# Patient Record
Sex: Female | Born: 1985 | Race: White | Hispanic: No | Marital: Single | Smoking: Never smoker
Health system: Southern US, Community
[De-identification: ages and names within clinical notes are randomized; demographics above are authoritative.]

## PROBLEM LIST (undated history)

## (undated) DIAGNOSIS — M549 Dorsalgia, unspecified: Secondary | ICD-10-CM

## (undated) DIAGNOSIS — G8929 Other chronic pain: Secondary | ICD-10-CM

## (undated) DIAGNOSIS — R519 Headache, unspecified: Secondary | ICD-10-CM

## (undated) DIAGNOSIS — E039 Hypothyroidism, unspecified: Secondary | ICD-10-CM

---
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
## 2007-11-26 ENCOUNTER — Emergency Department (HOSPITAL_COMMUNITY): Admission: EM | Admit: 2007-11-26 | Discharge: 2007-11-26 | Payer: Self-pay | Admitting: Emergency Medicine

## 2010-11-24 LAB — URINALYSIS, ROUTINE W REFLEX MICROSCOPIC
Bilirubin Urine: NEGATIVE
Nitrite: NEGATIVE
Protein, ur: NEGATIVE
Specific Gravity, Urine: 1.02
Urobilinogen, UA: 0.2

## 2010-11-24 LAB — PREGNANCY, URINE: Preg Test, Ur: NEGATIVE

## 2010-11-24 LAB — URINE MICROSCOPIC-ADD ON

## 2011-05-19 ENCOUNTER — Emergency Department (HOSPITAL_COMMUNITY): Payer: No Typology Code available for payment source

## 2011-05-19 ENCOUNTER — Encounter (HOSPITAL_COMMUNITY): Payer: Self-pay | Admitting: *Deleted

## 2011-05-19 ENCOUNTER — Emergency Department (HOSPITAL_COMMUNITY)
Admission: EM | Admit: 2011-05-19 | Discharge: 2011-05-19 | Disposition: A | Discharge status: Home / Self Care | Payer: No Typology Code available for payment source | Attending: Emergency Medicine | Admitting: Emergency Medicine

## 2011-05-19 DIAGNOSIS — S139XXA Sprain of joints and ligaments of unspecified parts of neck, initial encounter: Secondary | ICD-10-CM | POA: Insufficient documentation

## 2011-05-19 DIAGNOSIS — S39012A Strain of muscle, fascia and tendon of lower back, initial encounter: Secondary | ICD-10-CM

## 2011-05-19 DIAGNOSIS — M545 Low back pain, unspecified: Secondary | ICD-10-CM | POA: Insufficient documentation

## 2011-05-19 DIAGNOSIS — Y92410 Unspecified street and highway as the place of occurrence of the external cause: Secondary | ICD-10-CM | POA: Insufficient documentation

## 2011-05-19 DIAGNOSIS — G8929 Other chronic pain: Secondary | ICD-10-CM | POA: Insufficient documentation

## 2011-05-19 DIAGNOSIS — S335XXA Sprain of ligaments of lumbar spine, initial encounter: Secondary | ICD-10-CM | POA: Insufficient documentation

## 2011-05-19 DIAGNOSIS — S161XXA Strain of muscle, fascia and tendon at neck level, initial encounter: Secondary | ICD-10-CM

## 2011-05-19 MED ORDER — CYCLOBENZAPRINE HCL 10 MG PO TABS
10.0000 mg | ORAL_TABLET | Freq: Once | ORAL | Status: AC
  Administered 2011-05-19: 10 mg via ORAL
  Filled 2011-05-19: qty 1

## 2011-05-19 MED ORDER — HYDROCODONE-ACETAMINOPHEN 5-325 MG PO TABS: 1.0000 | ORAL_TABLET | Freq: Four times a day (QID) | ORAL | Status: AC | PRN

## 2011-05-19 MED ORDER — HYDROCODONE-ACETAMINOPHEN 5-325 MG PO TABS
1.0000 | ORAL_TABLET | Freq: Once | ORAL | Status: AC
  Administered 2011-05-19: 1 via ORAL
  Filled 2011-05-19: qty 1

## 2011-05-19 MED ORDER — IBUPROFEN 800 MG PO TABS
800.0000 mg | ORAL_TABLET | Freq: Once | ORAL | Status: AC
  Administered 2011-05-19: 800 mg via ORAL
  Filled 2011-05-19: qty 1

## 2011-05-19 MED ORDER — CYCLOBENZAPRINE HCL 10 MG PO TABS: ORAL_TABLET | ORAL | Status: DC

## 2011-05-19 NOTE — ED Notes (Signed)
Pt DC to home with steady gait 
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
## 2011-05-19 NOTE — Discharge Instructions (Signed)
Cryotherapy Cryotherapy means treatment with cold. Ice or gel packs can be used to reduce both pain and swelling. Ice is the most helpful within the first 24 to 48 hours after an injury or flareup from overusing a muscle or joint. Sprains, strains, spasms, burning pain, shooting pain, and aches can all be eased with ice. Ice can also be used when recovering from surgery. Ice is effective, has very few side effects, and is safe for most people to use. PRECAUTIONS  Ice is not a safe treatment option for people with:  Raynaud's phenomenon. This is a condition affecting small blood vessels in the extremities. Exposure to cold may cause your problems to return.   Cold hypersensitivity. There are many forms of cold hypersensitivity, including:   Cold urticaria. Red, itchy hives appear on the skin when the tissues begin to warm after being iced.   Cold erythema. This is a red, itchy rash caused by exposure to cold.   Cold hemoglobinuria. Red blood cells break down when the tissues begin to warm after being iced. The hemoglobin that carry oxygen are passed into the urine because they cannot combine with blood proteins fast enough.   Numbness or altered sensitivity in the area being iced.  If you have any of the following conditions, do not use ice until you have discussed cryotherapy with your caregiver:  Heart conditions, such as arrhythmia, angina, or chronic heart disease.   High blood pressure.   Healing wounds or open skin in the area being iced.   Current infections.   Rheumatoid arthritis.   Poor circulation.   Diabetes.  Ice slows the blood flow in the region it is applied. This is beneficial when trying to stop inflamed tissues from spreading irritating chemicals to surrounding tissues. However, if you expose your skin to cold temperatures for too long or without the proper protection, you can damage your skin or nerves. Watch for signs of skin damage due to cold. HOME CARE  INSTRUCTIONS Follow these tips to use ice and cold packs safely.  Place a dry or damp towel between the ice and skin. A damp towel will cool the skin more quickly, so you may need to shorten the time that the ice is used.   For a more rapid response, add gentle compression to the ice.   Ice for no more than 10 to 20 minutes at a time. The bonier the area you are icing, the less time it will take to get the benefits of ice.   Check your skin after 5 minutes to make sure there are no signs of a poor response to cold or skin damage.   Rest 20 minutes or more in between uses.   Once your skin is numb, you can end your treatment. You can test numbness by very lightly touching your skin. The touch should be so light that you do not see the skin dimple from the pressure of your fingertip. When using ice, most people will feel these normal sensations in this order: cold, burning, aching, and numbness.   Do not use ice on someone who cannot communicate their responses to pain, such as small children or people with dementia.  HOW TO MAKE AN ICE PACK Ice packs are the most common way to use ice therapy. Other methods include ice massage, ice baths, and cryo-sprays. Muscle creams that cause a cold, tingly feeling do not offer the same benefits that ice offers and should not be used as a substitute  unless recommended by your caregiver. To make an ice pack, do one of the following:  Place crushed ice or a bag of frozen vegetables in a sealable plastic bag. Squeeze out the excess air. Place this bag inside another plastic bag. Slide the bag into a pillowcase or place a damp towel between your skin and the bag.   Mix 3 parts water with 1 part rubbing alcohol. Freeze the mixture in a sealable plastic bag. When you remove the mixture from the freezer, it will be slushy. Squeeze out the excess air. Place this bag inside another plastic bag. Slide the bag into a pillowcase or place a damp towel between your skin  and the bag.  SEEK MEDICAL CARE IF:  You develop white spots on your skin. This may give the skin a blotchy (mottled) appearance.   Your skin turns blue or pale.   Your skin becomes waxy or hard.   Your swelling gets worse.  MAKE SURE YOU:   Understand these instructions.   Will watch your condition.   Will get help right away if you are not doing well or get worse.  Document Released: 10/06/2010 Document Revised: 01/29/2011 Document Reviewed: 10/06/2010 [facility] Patient Information 2012 [place], [place].Muscle Stra   A muscle strain, or pulled muscle, occurs when a muscle is over-stretched. A small number of muscle fibers may also be torn. This is especially common in athletes. This happens when a sudden violent force placed on a muscle pushes it past its capacity. Usually, recovery from a pulled muscle takes 1 to 2 weeks. But complete healing will take 5 to 6 weeks. There are millions of muscle fibers. Following injury, your body will usually return to normal quickly. HOME CARE INSTRUCTIONS   While awake, apply ice to the sore muscle for 15 to 20 minutes each hour for the first 2 days. Put ice in a plastic bag and place a towel between the bag of ice and your skin.   Do not use the pulled muscle for several days. Do not use the muscle if you have pain.   You may wrap the injured area with an elastic bandage for comfort. Be careful not to bind it too tightly. This may interfere with blood circulation.   Only take over-the-counter or prescription medicines for pain, discomfort, or fever as directed by your caregiver. Do not use aspirin as this will increase bleeding (bruising) at injury site.   Warming up before exercise helps prevent muscle strains.  SEEK MEDICAL CARE IF:  There is increased pain or swelling in the affected area. MAKE SURE YOU:   Understand these instructions.   Will watch your condition.   Will get help right away if you are not doing well or get worse.    Document Released: 02/09/2005 Document Revised: 01/29/2011 Document Reviewed: 09/08/2006 [facility] Patient Information 2012 [place], [place].      Take the meds as directed.  Take ibuprofen 800 mg every 8 hrs with food.  Apply ice several times daily to areas of soreness.  Follow up with dr. Hilda Lias as needed.  You might consider getting re-established with a pain management clinic.

## 2011-05-19 NOTE — ED Notes (Signed)
C-collar applied secondary to neck pain.

## 2011-05-23 NOTE — ED Provider Notes (Signed)
Medical screening examination/treatment/procedure(s) were performed by non-physician practitioner and as supervising physician I was immediately available for consultation/collaboration.  Flint Melter, MD 05/23/11 [phone]

## 2011-06-20 ENCOUNTER — Emergency Department (HOSPITAL_COMMUNITY)
Admission: EM | Admit: 2011-06-20 | Discharge: 2011-06-21 | Disposition: A | Discharge status: Home / Self Care | Payer: Self-pay | Attending: Emergency Medicine | Admitting: Emergency Medicine

## 2011-06-20 ENCOUNTER — Encounter (HOSPITAL_COMMUNITY): Payer: Self-pay | Admitting: *Deleted

## 2011-06-20 DIAGNOSIS — R112 Nausea with vomiting, unspecified: Secondary | ICD-10-CM | POA: Insufficient documentation

## 2011-06-20 DIAGNOSIS — M545 Low back pain, unspecified: Secondary | ICD-10-CM | POA: Insufficient documentation

## 2011-06-20 DIAGNOSIS — K5900 Constipation, unspecified: Secondary | ICD-10-CM | POA: Insufficient documentation

## 2011-06-20 DIAGNOSIS — R10819 Abdominal tenderness, unspecified site: Secondary | ICD-10-CM | POA: Insufficient documentation

## 2011-06-20 DIAGNOSIS — R350 Frequency of micturition: Secondary | ICD-10-CM | POA: Insufficient documentation

## 2011-06-20 DIAGNOSIS — R109 Unspecified abdominal pain: Secondary | ICD-10-CM | POA: Insufficient documentation

## 2011-06-20 DIAGNOSIS — N12 Tubulo-interstitial nephritis, not specified as acute or chronic: Secondary | ICD-10-CM | POA: Insufficient documentation

## 2011-06-20 DIAGNOSIS — R51 Headache: Secondary | ICD-10-CM | POA: Insufficient documentation

## 2011-06-20 DIAGNOSIS — Z79899 Other long term (current) drug therapy: Secondary | ICD-10-CM | POA: Insufficient documentation

## 2011-06-20 LAB — URINE MICROSCOPIC-ADD ON

## 2011-06-20 LAB — URINALYSIS, ROUTINE W REFLEX MICROSCOPIC
Glucose, UA: NEGATIVE mg/dL
Protein, ur: NEGATIVE mg/dL
pH: 6 (ref 5.0–8.0)

## 2011-06-20 LAB — PREGNANCY, URINE: Preg Test, Ur: NEGATIVE

## 2011-06-20 MED ORDER — ONDANSETRON 4 MG PO TBDP
8.0000 mg | ORAL_TABLET | Freq: Once | ORAL | Status: AC
  Administered 2011-06-20: 8 mg via ORAL
  Filled 2011-06-20: qty 2

## 2011-06-20 MED ORDER — KETOROLAC TROMETHAMINE 60 MG/2ML IM SOLN
60.0000 mg | Freq: Once | INTRAMUSCULAR | Status: AC
  Administered 2011-06-20: 60 mg via INTRAMUSCULAR
  Filled 2011-06-20: qty 2

## 2011-06-20 NOTE — ED Provider Notes (Signed)
History     CSN: [id number]  Arrival date & time 06/20/11  2209   First MD Initiated Contact with Patient 06/20/11 2246      Chief Complaint  Patient presents with  . Back Pain    (Consider location/radiation/quality/duration/timing/severity/associated sxs/prior treatment) Patient is a 26 y.o. female presenting with back pain. The history is provided by the patient.  Back Pain  This is a new problem. The current episode started more than 2 days ago. The problem occurs constantly. The problem has been gradually worsening. The pain is associated with no known injury. The pain is present in the lumbar spine. The pain does not radiate. The pain is at a severity of 6/10. The pain is moderate. The symptoms are aggravated by bending and twisting. Associated symptoms include headaches. Pertinent negatives include no fever, no numbness, no abdominal pain, no abdominal swelling, no bowel incontinence, no perianal numbness, no bladder incontinence, no dysuria, no pelvic pain, no leg pain, no paresthesias and no weakness.  Pt stats pain is across her back. Hx of back problems, but states this does not feel the same. States "I feel like it is my kidneys." Pt reports hx of pyelonephritis, however, states this does not feel like that, but also does not feel like any pain she has had before. States began having nausea and vomiting for the last two days. States cant keep anything down. Also headaches. Denies fever, chills, neck pain or stiffness, chest pain, abdominal pain, diarrhea.   Past Medical History  Diagnosis Date  . Back pain     History reviewed. No pertinent past surgical history.  No family history on file.  History  Substance Use Topics  . Smoking status: Never Smoker   . Smokeless tobacco: Not on file  . Alcohol Use: No    OB History    Grav Para Term Preterm Abortions TAB SAB Ect Mult Living                  Review of Systems  Constitutional: Negative for fever and chills.    Respiratory: Negative.   Cardiovascular: Negative.   Gastrointestinal: Positive for nausea, vomiting and constipation. Negative for abdominal pain, diarrhea and bowel incontinence.  Genitourinary: Positive for frequency and flank pain. Negative for bladder incontinence, dysuria, urgency, vaginal bleeding, vaginal discharge and pelvic pain.  Musculoskeletal: Positive for back pain.  Skin: Negative.   Neurological: Positive for headaches. Negative for weakness, numbness and paresthesias.    Allergies  Penicillins  Home Medications   Current Outpatient Rx  Name Route Sig Dispense Refill  . ACETAMINOPHEN 500 MG PO TABS Oral Take 1,000-1,500 mg by mouth every 6 (six) hours as needed. For headache    . CYCLOBENZAPRINE HCL 10 MG PO TABS  1/2 to one tab po TID 20 tablet 0  . LEVONORGESTREL 20 MCG/24HR IU IUD Intrauterine 1 each by Intrauterine route once.      BP 125/75  Pulse 84  Temp(Src) 98.2 F (36.8 C) (Oral)  Resp 18  SpO2 100%  Physical Exam  Nursing note and vitals reviewed. Constitutional: She is oriented to person, place, and time. She appears well-developed and well-nourished. No distress.  HENT:  Head: Normocephalic and atraumatic.  Eyes: Conjunctivae are normal.  Neck: Neck supple.  Cardiovascular: Normal rate and regular rhythm.   Pulmonary/Chest: Effort normal and breath sounds normal. No respiratory distress. She has no wheezes.  Abdominal: Soft. Bowel sounds are normal.       Suprapubic tenderness, no  guarding, no rebound tenderness. bilateral CVA tenderness  Neurological: She is alert and oriented to person, place, and time.  Skin: Skin is warm and dry.  Psychiatric: She has a normal mood and affect.    ED Course  Procedures (including critical care time)  Pt with lower back pain, hx of pyelonephritis. No hx of kidney stones. Will get UA. VS normal. She does not appear toxic  Results for orders placed during the hospital encounter of 06/20/11  URINALYSIS,  ROUTINE W REFLEX MICROSCOPIC      Component Value Range   Color, Urine YELLOW  YELLOW    APPearance CLOUDY (*) CLEAR    Specific Gravity, Urine 1.023  1.005 - 1.030    pH 6.0  5.0 - 8.0    Glucose, UA NEGATIVE  NEGATIVE (mg/dL)   Hgb urine dipstick TRACE (*) NEGATIVE    Bilirubin Urine NEGATIVE  NEGATIVE    Ketones, ur NEGATIVE  NEGATIVE (mg/dL)   Protein, ur NEGATIVE  NEGATIVE (mg/dL)   Urobilinogen, UA 0.2  0.0 - 1.0 (mg/dL)   Nitrite POSITIVE (*) NEGATIVE    Leukocytes, UA SMALL (*) NEGATIVE   PREGNANCY, URINE      Component Value Range   Preg Test, Ur NEGATIVE  NEGATIVE   URINE MICROSCOPIC-ADD ON      Component Value Range   Squamous Epithelial / LPF FEW (*) RARE    WBC, UA 7-10  <3 (WBC/hpf)   RBC / HPF 3-6  <3 (RBC/hpf)   Bacteria, UA MANY (*) RARE    No results found.  UA infected. Pt denies vaginal discharge, denies vaginal bleeding. Will treat UTI. Pt is tolerating PO fluids in ED. Continues to be in no distress. She has no other medical problems. Will follow up outpatient.   1. Pyelonephritis       MDM         Lottie Mussel, PA 06/21/11 0024  Lottie Mussel, PA 06/21/11 0025

## 2011-06-20 NOTE — ED Notes (Signed)
The pt has had flank pain for one week with nausea and she has had headaches.  lmp she has an iud

## 2011-06-21 MED ORDER — CIPROFLOXACIN HCL 500 MG PO TABS: 500.0000 mg | ORAL_TABLET | Freq: Two times a day (BID) | ORAL | Status: AC

## 2011-06-21 MED ORDER — PROMETHAZINE HCL 12.5 MG PO TABS: 12.5000 mg | ORAL_TABLET | Freq: Four times a day (QID) | ORAL | Status: DC | PRN

## 2011-06-21 MED ORDER — HYDROCODONE-ACETAMINOPHEN 5-500 MG PO TABS: 1.0000 | ORAL_TABLET | Freq: Four times a day (QID) | ORAL | Status: AC | PRN

## 2011-06-21 NOTE — Discharge Instructions (Signed)
Your urine is infected. Make sure to continue to drink plenty of fluids. Take phenergan for nausea as prescribed. Take vicodin as prescribed as needed for severe pain. Cipro as prescribed until all gone for the infection. Follow up or have yourself rechecked in 3-5 days. Return if worsening or unable to keep down medications   Pyelonephritis, Adult Pyelonephritis is a kidney infection. In general, there are 2 main types of pyelonephritis:  Infections that come on quickly without any warning (acute pyelonephritis).   Infections that persist for a long period of time (chronic pyelonephritis).  CAUSES  Two main causes of pyelonephritis are:  Bacteria traveling from the bladder to the kidney. This is a problem especially in pregnant women. The urine in the bladder can become filled with bacteria from multiple causes, including:   Inflammation of the prostate gland (prostatitis).   Sexual intercourse in females.   Bladder infection (cystitis).   Bacteria traveling from the bloodstream to the tissue part of the kidney.  Problems that may increase your risk of getting a kidney infection include:  Diabetes.   Kidney stones or bladder stones.   Cancer.   Catheters placed in the bladder.   Other abnormalities of the kidney or ureter.  SYMPTOMS   Abdominal pain.   Pain in the side or flank area.   Fever.   Chills.   Upset stomach.   Blood in the urine (dark urine).   Frequent urination.   Strong or persistent urge to urinate.   Burning or stinging when urinating.  DIAGNOSIS  Your caregiver may diagnose your kidney infection based on your symptoms. A urine sample may also be taken. TREATMENT  In general, treatment depends on how severe the infection is.   If the infection is mild and caught early, your caregiver may treat you with oral antibiotics and send you home.   If the infection is more severe, the bacteria may have gotten into the bloodstream. This will require  intravenous (IV) antibiotics and a hospital stay. Symptoms may include:   High fever.   Severe flank pain.   Shaking chills.   Even after a hospital stay, your caregiver may require you to be on oral antibiotics for a period of time.   Other treatments may be required depending upon the cause of the infection.  HOME CARE INSTRUCTIONS   Take your antibiotics as directed. Finish them even if you start to feel better.   Make an appointment to have your urine checked to make sure the infection is gone.   Drink enough fluids to keep your urine clear or pale yellow.   Take medicines for the bladder if you have urgency and frequency of urination as directed by your caregiver.  SEEK IMMEDIATE MEDICAL CARE IF:   You have a fever.   You are unable to take your antibiotics or fluids.   You develop shaking chills.   You experience extreme weakness or fainting.   There is no improvement after 2 days of treatment.  MAKE SURE YOU:  Understand these instructions.   Will watch your condition.   Will get help right away if you are not doing well or get worse.  Document Released: 02/09/2005 Document Revised: 01/29/2011 Document Reviewed: 07/16/2010 [facility] Patient Information 2012 [place], [place].

## 2011-06-21 NOTE — ED Provider Notes (Signed)
Medical screening examination/treatment/procedure(s) were performed by non-physician practitioner and as supervising physician I was immediately available for consultation/collaboration.  Ulis Kaps M Lyndi Holbein, MD 06/21/11 0544 

## 2011-06-23 LAB — URINE CULTURE: Culture  Setup Time: 201304281137

## 2011-06-24 NOTE — ED Notes (Signed)
+   urine Patient treated with Cipro-sensitive to same-chart appended per protocol MD. 

## 2011-07-11 ENCOUNTER — Emergency Department (HOSPITAL_COMMUNITY)
Admission: EM | Admit: 2011-07-11 | Discharge: 2011-07-11 | Disposition: A | Discharge status: Home / Self Care | Payer: Medicaid Other | Attending: Emergency Medicine | Admitting: Emergency Medicine

## 2011-07-11 ENCOUNTER — Encounter (HOSPITAL_COMMUNITY): Payer: Self-pay | Admitting: Emergency Medicine

## 2011-07-11 DIAGNOSIS — B9689 Other specified bacterial agents as the cause of diseases classified elsewhere: Secondary | ICD-10-CM | POA: Insufficient documentation

## 2011-07-11 DIAGNOSIS — R319 Hematuria, unspecified: Secondary | ICD-10-CM | POA: Insufficient documentation

## 2011-07-11 DIAGNOSIS — A499 Bacterial infection, unspecified: Secondary | ICD-10-CM | POA: Insufficient documentation

## 2011-07-11 DIAGNOSIS — S30816A Abrasion of unspecified external genital organs, female, initial encounter: Secondary | ICD-10-CM

## 2011-07-11 DIAGNOSIS — N760 Acute vaginitis: Secondary | ICD-10-CM | POA: Insufficient documentation

## 2011-07-11 HISTORY — DX: Dorsalgia, unspecified: M54.9

## 2011-07-11 HISTORY — DX: Other chronic pain: G89.29

## 2011-07-11 LAB — URINALYSIS, ROUTINE W REFLEX MICROSCOPIC
Leukocytes, UA: NEGATIVE
Nitrite: NEGATIVE
Protein, ur: NEGATIVE mg/dL
Specific Gravity, Urine: <1.005 — ABNORMAL LOW (ref 1.005–1.030)
Urobilinogen, UA: 0.2 mg/dL (ref 0.0–1.0)

## 2011-07-11 LAB — WET PREP, GENITAL

## 2011-07-11 LAB — URINE MICROSCOPIC-ADD ON

## 2011-07-11 LAB — PREGNANCY, URINE: Preg Test, Ur: NEGATIVE

## 2011-07-11 MED ORDER — METRONIDAZOLE 500 MG PO TABS: 500.0000 mg | ORAL_TABLET | Freq: Two times a day (BID) | ORAL | Status: DC

## 2011-07-11 NOTE — ED Notes (Signed)
Pt states vaginal swelling and irritation. States "there is a place inside, circle shaped and it burns right there when I pee. I also have peed blood clots earlier, but its not like a period, only when I wipe." NAD. Pt has IUD which was placed 2 years ago.

## 2011-07-11 NOTE — Discharge Instructions (Signed)
RESOURCE GUIDE  Dental Problems  Patients with Medicaid: [facility]                     [facility] [address]                                           [address] Phone:  [phone]                                                  Phone:  [phone]  If unable to pay or uninsured, contact:  [facility] or [facility] to become qualified for the adult dental clinic.  Chronic Pain Problems Contact [facility]  [phone] Patients need to be referred by their primary care doctor.  Insufficient Money for Medicine Contact [employer]:  call "211" or [facility] 271-5999.  No Primary Care Doctor Call [facility]  832-8000 Other agencies that provide inexpensive medical care    [facility]  832-8035    [facility]  832-7272    [facility]  [phone]    Women's Clinic  832-4777    [facility]  [phone]    [facility]  [phone]  Psychological Services [facility]  [phone] [facility]  [phone] [facility]   800 853-5163 (emergency services 641-4993)  Substance Abuse Resources [facility]  336-882-2125 [facility] 336-784-9470 [facility] 336-285-9073 [facility] 336-845-3988 Residential & Outpatient Substance Abuse Program  800-659-3381  Abuse/Neglect [place] Child Abuse Hotline (336) 641-3795 [place] Child Abuse Hotline 800-378-5315 (After Hours)  Emergency Shelter [facility] (336) 271-5985  Maternity Homes [facility] (336) 275-9566 [facility] (704) 372-4663  MRSA Hotline #:   832-7006    [place] Resources  [facility] [place]     [employer]                          [facility] [address] [place]                       [address]      [address]  [place]                                                [place]                            [place] Phone:  [phone]                                   Phone:  [phone]                 Phone:  [phone]  [facility] Phone:  [phone]    [facility] Child Abuse Hotline (239)832-1145 (732) 456-8648 (After Hours)   Take the prescription as directed.  Wash the area gently with soap and water at least twice a day, and whenever you use the toilet.  Call your regular medical doctor or your OB/GYN doctor on Monday to schedule a follow up appointment for a recheck within the next week.  Return to the Emergency Department immediately if worsening.

## 2011-07-11 NOTE — ED Provider Notes (Signed)
History     CSN: [id number]  Arrival date & time 07/11/11  1730   First MD Initiated Contact with Patient 07/11/11 1742      Chief Complaint  Patient presents with  . Abdominal Cramping  . Hematuria  . Back Pain    HPI Pt was seen at 1745.   Per pt, c/o gradual onset and persistence of constantly "seeing blood down there" for the past several days.  Pt states she sees the bleeding "when she wipes" after using the toilet.  She is unsure if it is hematuria or vaginal bleeding.  Denies rectal bleeding.  Denies abd/pelvic pain, no back/flank pain, no dysuria, no fevers, no rash, no N/V/D.     Past Medical History  Diagnosis Date  . Chronic back pain     History reviewed. No pertinent past surgical history.   History  Substance Use Topics  . Smoking status: Never Smoker   . Smokeless tobacco: Not on file  . Alcohol Use: No     Review of Systems ROS: Statement: All systems negative except as marked or noted in the HPI; Constitutional: Negative for fever and chills. ; ; Eyes: Negative for eye pain, redness and discharge. ; ; ENMT: Negative for ear pain, hoarseness, nasal congestion, sinus pressure and sore throat. ; ; Cardiovascular: Negative for chest pain, palpitations, diaphoresis, dyspnea and peripheral edema. ; ; Respiratory: Negative for cough, wheezing and stridor. ; ; Gastrointestinal: Negative for nausea, vomiting, diarrhea, abdominal pain, blood in stool, hematemesis, jaundice and rectal bleeding. . ; ; Genitourinary: Negative for dysuria, flank pain.; GYN:  +vaginal bleeding, +vaginal discharge, no vulvar pain.; ; Musculoskeletal: Negative for back pain and neck pain. Negative for swelling and trauma.; ; Skin: Negative for pruritus, rash, abrasions, blisters, bruising and skin lesion.; ; Neuro: Negative for headache, lightheadedness and neck stiffness. Negative for weakness, altered level of consciousness , altered mental status, extremity weakness, paresthesias, involuntary  movement, seizure and syncope.     Allergies  Penicillins  Home Medications   Current Outpatient Rx  Name Route Sig Dispense Refill  . ACETAMINOPHEN 500 MG PO TABS Oral Take 1,000-1,500 mg by mouth every 6 (six) hours as needed. For headache    . LEVONORGESTREL 20 MCG/24HR IU IUD Intrauterine 1 each by Intrauterine route once.    Marland Kitchen PROMETHAZINE HCL 12.5 MG PO TABS Oral Take 1 tablet (12.5 mg total) by mouth every 6 (six) hours as needed for nausea. 12 tablet 0    BP 121/75  Pulse 87  Temp(Src) 98.1 F (36.7 C) (Oral)  Resp 20  Ht 5\' 2"  (1.575 m)  Wt 180 lb (81.647 kg)  BMI 32.92 kg/m2  SpO2 100%  Physical Exam 1750: Physical examination:  Nursing notes reviewed; Vital signs and O2 SAT reviewed;  Constitutional: Well developed, Well nourished, Well hydrated, In no acute distress; Head:  Normocephalic, atraumatic; Eyes: EOMI, PERRL, No scleral icterus; ENMT: Mouth and pharynx normal, Mucous membranes moist; Neck: Supple, Full range of motion, No lymphadenopathy; Cardiovascular: Regular rate and rhythm, No murmur, rub, or gallop; Respiratory: Breath sounds clear & equal bilaterally, No rales, rhonchi, wheezes, or rub, Normal respiratory effort/excursion; Chest: Nontender, Movement normal; Abdomen: Soft, Nontender, Nondistended, Normal bowel sounds; Genitourinary: No CVA tenderness; Pelvic exam performed with permission of pt and female ED tech assist during exam.  External genitalia w/o lesions. Vaginal vault without discharge or bleeding.  Cervix w/o lesions, not friable, IUD strings visualized at os, GC/chlam and wet prep obtained and sent to  lab.  Bimanual exam w/o CMT, uterine or adnexal tenderness.  No bartholin's abscess bilat.  No areas of tenderness, fluctuance or rash.  Punctate area of scant amount of bleeding noted at 6:00 area of introitus, no purulent drainage.; Extremities: Pulses normal, No tenderness, No edema, No calf edema or asymmetry.; Neuro: AA&Ox3, Major CN grossly  intact.  No gross focal motor or sensory deficits in extremities.; Skin: Color normal, Warm, Dry   ED Course  Procedures   1900:  Very small punctate area of scant amount of bleeding at vaginal introitus approx 6:00 area.  When pointed this out to pt, she states she "might have cut myself shaving down there."  No erythema, no palpable abscess, no purulent drainage.  Likely source of blood pt is seeing when she uses the toilet.  2000:  Will tx for BV.  No cellulitis surrounding puncate area of bleeding, doubt infection or abscess at this time.  Dx testing d/w pt.  Questions answered.  Verb understanding, agreeable to d/c home with outpt f/u.   MDM  MDM Reviewed: previous chart, nursing note and vitals Interpretation: labs   Results for orders placed during the hospital encounter of 07/11/11  PREGNANCY, URINE      Component Value Range   Preg Test, Ur NEGATIVE  NEGATIVE   URINALYSIS, ROUTINE W REFLEX MICROSCOPIC      Component Value Range   Color, Urine YELLOW  YELLOW    APPearance CLEAR  CLEAR    Specific Gravity, Urine <1.005 (*) 1.005 - 1.030    pH 6.5  5.0 - 8.0    Glucose, UA NEGATIVE  NEGATIVE (mg/dL)   Hgb urine dipstick SMALL (*) NEGATIVE    Bilirubin Urine NEGATIVE  NEGATIVE    Ketones, ur NEGATIVE  NEGATIVE (mg/dL)   Protein, ur NEGATIVE  NEGATIVE (mg/dL)   Urobilinogen, UA 0.2  0.0 - 1.0 (mg/dL)   Nitrite NEGATIVE  NEGATIVE    Leukocytes, UA NEGATIVE  NEGATIVE   WET PREP, GENITAL      Component Value Range   Yeast Wet Prep HPF POC NONE SEEN  NONE SEEN    Trich, Wet Prep NONE SEEN  NONE SEEN    Clue Cells Wet Prep HPF POC FEW (*) NONE SEEN    WBC, Wet Prep HPF POC FEW (*) NONE SEEN   URINE MICROSCOPIC-ADD ON      Component Value Range   Squamous Epithelial / LPF FEW (*) RARE    WBC, UA 3-6  <3 (WBC/hpf)   RBC / HPF 3-6  <3 (RBC/hpf)                 Laray Anger, DO 07/13/11 1635

## 2011-07-11 NOTE — ED Notes (Signed)
Pt c/o lower abd pain with vaginal bleeding. Pt states she should not be bleeding because she has an IUD.

## 2011-07-14 LAB — GC/CHLAMYDIA PROBE AMP, GENITAL
Chlamydia, DNA Probe: NEGATIVE
GC Probe Amp, Genital: NEGATIVE

## 2011-07-19 ENCOUNTER — Emergency Department (HOSPITAL_COMMUNITY)
Admission: EM | Admit: 2011-07-19 | Discharge: 2011-07-19 | Disposition: A | Discharge status: Home / Self Care | Payer: Medicaid Other | Attending: Emergency Medicine | Admitting: Emergency Medicine

## 2011-07-19 ENCOUNTER — Encounter (HOSPITAL_COMMUNITY): Payer: Self-pay | Admitting: *Deleted

## 2011-07-19 DIAGNOSIS — N390 Urinary tract infection, site not specified: Secondary | ICD-10-CM

## 2011-07-19 DIAGNOSIS — G8929 Other chronic pain: Secondary | ICD-10-CM | POA: Insufficient documentation

## 2011-07-19 LAB — URINALYSIS, ROUTINE W REFLEX MICROSCOPIC
Bilirubin Urine: NEGATIVE
Ketones, ur: NEGATIVE mg/dL
Leukocytes, UA: NEGATIVE
Nitrite: NEGATIVE
Specific Gravity, Urine: >1.03 — ABNORMAL HIGH (ref 1.005–1.030)
Urobilinogen, UA: 0.2 mg/dL (ref 0.0–1.0)
pH: 6 (ref 5.0–8.0)

## 2011-07-19 LAB — PREGNANCY, URINE: Preg Test, Ur: NEGATIVE

## 2011-07-19 LAB — URINE MICROSCOPIC-ADD ON

## 2011-07-19 MED ORDER — NITROFURANTOIN MONOHYD MACRO 100 MG PO CAPS: 100.0000 mg | ORAL_CAPSULE | Freq: Two times a day (BID) | ORAL | Status: DC

## 2011-07-19 MED ORDER — PHENAZOPYRIDINE HCL 200 MG PO TABS: 200.0000 mg | ORAL_TABLET | Freq: Three times a day (TID) | ORAL | Status: DC

## 2011-07-19 MED ORDER — KETOROLAC TROMETHAMINE 60 MG/2ML IM SOLN
60.0000 mg | Freq: Once | INTRAMUSCULAR | Status: AC
  Administered 2011-07-19: 60 mg via INTRAMUSCULAR
  Filled 2011-07-19: qty 2

## 2011-07-19 MED ORDER — PHENAZOPYRIDINE HCL 100 MG PO TABS
200.0000 mg | ORAL_TABLET | Freq: Once | ORAL | Status: AC
  Administered 2011-07-19: 200 mg via ORAL
  Filled 2011-07-19: qty 2

## 2011-07-19 NOTE — Discharge Instructions (Signed)
Take ibuprofen 600 mg 4 times a day for pain. Take the Pyridium 3 times a day for the pain on urination. Take the antibiotic until gone which will be about 10 days. You need to be rechecked by a gynecologist, youcan call Dr. Forestine Chute office to get an appointment to be reevaluated.

## 2011-07-19 NOTE — ED Notes (Signed)
Hematuria for 2 weeks, states she does not have medicaid so she cannot see a doctor except for birth control

## 2011-07-19 NOTE — ED Provider Notes (Signed)
History     CSN: [id number]  Arrival date & time 07/19/11  1804   First MD Initiated Contact with Patient 07/19/11 1944      Chief Complaint  Patient presents with  . Hematuria    (Consider location/radiation/quality/duration/timing/severity/associated sxs/prior treatment) HPI  Patient states she's had lower abdominal pain for the past month. She relates she has been treated for urinary tract infection twice, but states she's not helping. She has had a pelvic done. She has nausea and states she had vomiting 2 days ago she has dysuria, frequency, nocturia 1-2 times a night. She also sees blood in her urine off and on. Patient also has chronic low back pain and states she's been having low back pain.  PCP [facility]  Past Medical History  Diagnosis Date  . Chronic back pain     History reviewed. No pertinent past surgical history.  No family history on file.  History  Substance Use Topics  . Smoking status: Never Smoker   . Smokeless tobacco: Not on file  . Alcohol Use: No  lives with father unemployed  OB History    Grav Para Term Preterm Abortions TAB SAB Ect Mult Living                  Review of Systems  All other systems reviewed and are negative.    Allergies  Penicillins  Home Medications   Current Outpatient Rx  Name Route Sig Dispense Refill  . ACETAMINOPHEN 500 MG PO TABS Oral Take 1,000-1,500 mg by mouth every 6 (six) hours as needed. For headache    . LEVONORGESTREL 20 MCG/24HR IU IUD Intrauterine 1 each by Intrauterine route once.    Marland Kitchen PROMETHAZINE HCL 12.5 MG PO TABS Oral Take 1 tablet (12.5 mg total) by mouth every 6 (six) hours as needed for nausea. 12 tablet 0    BP 125/87  Pulse 94  Temp(Src) 97.9 F (36.6 C) (Oral)  Resp 20  Ht 5\' 2"  (1.575 m)  Wt 175 lb (79.379 kg)  BMI 32.01 kg/m2  SpO2 99%  Vital signs normal    Physical Exam  Constitutional: She is oriented to person, place, and time. She appears  well-developed and well-nourished.  Non-toxic appearance. She does not appear ill. No distress.  HENT:  Head: Normocephalic and atraumatic.  Right Ear: External ear normal.  Left Ear: External ear normal.  Nose: Nose normal. No mucosal edema or rhinorrhea.  Mouth/Throat: Oropharynx is clear and moist and mucous membranes are normal. No dental abscesses or uvula swelling.  Eyes: Conjunctivae and EOM are normal. Pupils are equal, round, and reactive to light.  Neck: Normal range of motion and full passive range of motion without pain. Neck supple.  Cardiovascular: Normal rate, regular rhythm and normal heart sounds.  Exam reveals no gallop and no friction rub.   No murmur heard. Pulmonary/Chest: Effort normal and breath sounds normal. No respiratory distress. She has no wheezes. She has no rhonchi. She has no rales. She exhibits no tenderness and no crepitus.  Abdominal: Soft. Normal appearance and bowel sounds are normal. She exhibits no distension. There is tenderness. There is no rebound and no guarding.       Mild lower abdominal discomfort without localization or guarding or rebound  Musculoskeletal: Normal range of motion. She exhibits no edema and no tenderness.       Moves all extremities well.   Neurological: She is alert and oriented to person, place, and time.  She has normal strength. No cranial nerve deficit.  Skin: Skin is warm, dry and intact. No rash noted. No erythema. No pallor.  Psychiatric: She has a normal mood and affect. Her speech is normal and behavior is normal. Her mood appears not anxious.    ED Course  Procedures (including critical care time)  Prior records reviewed. Patient was seen on 427 and had a urine culture that grew out over 100,000 colonies of Escherichia coli that was sensitive to Cipro, Macrobid, and Keflex. It was resistant to Septra DS. She was given Cipro 7 days. She was seen again on 5/18 and had a pelvic exam done. It wet prep showed positive clue  cells and she was treated with Flagyl. Her GC and Chlamydia were negative. Pelvic exam not repeated today. Patient states her last sexual contact was over 6 weeks ago. She relates her last normal period was 2 years ago, which she relates to having an IUD inserted.  She requested narcotic pain medicines states "I have a high pain tolerance". I've explained to her that narcotics are inappropriate for urinary tract infection pain. She states she's been given it in the past, when I review her chart she was given hydrocodone by the two PAs who saw her the  prior 2 visits. However I do not feel this is appropriate. She was given Toradol IM and started on Pyridium.   Results for orders placed during the hospital encounter of 07/19/11  URINALYSIS, ROUTINE W REFLEX MICROSCOPIC      Component Value Range   Color, Urine YELLOW  YELLOW    APPearance HAZY (*) CLEAR    Specific Gravity, Urine >1.030 (*) 1.005 - 1.030    pH 6.0  5.0 - 8.0    Glucose, UA NEGATIVE  NEGATIVE (mg/dL)   Hgb urine dipstick MODERATE (*) NEGATIVE    Bilirubin Urine NEGATIVE  NEGATIVE    Ketones, ur NEGATIVE  NEGATIVE (mg/dL)   Protein, ur NEGATIVE  NEGATIVE (mg/dL)   Urobilinogen, UA 0.2  0.0 - 1.0 (mg/dL)   Nitrite NEGATIVE  NEGATIVE    Leukocytes, UA NEGATIVE  NEGATIVE   PREGNANCY, URINE      Component Value Range   Preg Test, Ur NEGATIVE  NEGATIVE   URINE MICROSCOPIC-ADD ON      Component Value Range   Squamous Epithelial / LPF FEW (*) RARE    WBC, UA 11-20  <3 (WBC/hpf)   RBC / HPF 7-10  <3 (RBC/hpf)   Bacteria, UA FEW (*) RARE    Laboratory interpretation all normal except persistent urinary tract infection     1. Urinary tract infection     New Prescriptions   NITROFURANTOIN, MACROCRYSTAL-MONOHYDRATE, (MACROBID) 100 MG CAPSULE    Take 1 capsule (100 mg total) by mouth 2 (two) times daily.   PHENAZOPYRIDINE (PYRIDIUM) 200 MG TABLET    Take 1 tablet (200 mg total) by mouth 3 (three) times daily.    Plan  discharge  Devoria Albe, MD, FACEP   MDM          Ward Givens, MD 07/19/11 2123

## 2011-07-20 ENCOUNTER — Encounter (HOSPITAL_COMMUNITY): Payer: Self-pay | Admitting: Emergency Medicine

## 2011-07-20 ENCOUNTER — Emergency Department (HOSPITAL_COMMUNITY): Payer: Medicaid Other

## 2011-07-20 ENCOUNTER — Emergency Department (HOSPITAL_COMMUNITY)
Admission: EM | Admit: 2011-07-20 | Discharge: 2011-07-20 | Disposition: A | Discharge status: Home / Self Care | Payer: Medicaid Other | Attending: Emergency Medicine | Admitting: Emergency Medicine

## 2011-07-20 DIAGNOSIS — N390 Urinary tract infection, site not specified: Secondary | ICD-10-CM

## 2011-07-20 DIAGNOSIS — R109 Unspecified abdominal pain: Secondary | ICD-10-CM | POA: Insufficient documentation

## 2011-07-20 DIAGNOSIS — M549 Dorsalgia, unspecified: Secondary | ICD-10-CM | POA: Insufficient documentation

## 2011-07-20 DIAGNOSIS — R319 Hematuria, unspecified: Secondary | ICD-10-CM | POA: Insufficient documentation

## 2011-07-20 DIAGNOSIS — R300 Dysuria: Secondary | ICD-10-CM | POA: Insufficient documentation

## 2011-07-20 DIAGNOSIS — G8929 Other chronic pain: Secondary | ICD-10-CM | POA: Insufficient documentation

## 2011-07-20 DIAGNOSIS — R509 Fever, unspecified: Secondary | ICD-10-CM | POA: Insufficient documentation

## 2011-07-20 LAB — URINE MICROSCOPIC-ADD ON

## 2011-07-20 LAB — URINALYSIS, ROUTINE W REFLEX MICROSCOPIC
Glucose, UA: NEGATIVE mg/dL
Ketones, ur: 15 mg/dL — AB
pH: 6 (ref 5.0–8.0)

## 2011-07-20 MED ORDER — CEPHALEXIN 500 MG PO CAPS: 500.0000 mg | ORAL_CAPSULE | Freq: Four times a day (QID) | ORAL | Status: AC

## 2011-07-20 MED ORDER — HYDROCODONE-ACETAMINOPHEN 5-500 MG PO TABS: 1.0000 | ORAL_TABLET | Freq: Four times a day (QID) | ORAL | Status: AC | PRN

## 2011-07-20 MED ORDER — OXYCODONE-ACETAMINOPHEN 5-325 MG PO TABS
1.0000 | ORAL_TABLET | Freq: Once | ORAL | Status: AC
  Administered 2011-07-20: 1 via ORAL
  Filled 2011-07-20: qty 1

## 2011-07-20 NOTE — ED Provider Notes (Addendum)
History   This chart was scribed for Gwyneth Sprout, MD by Brooks Sailors. The patient was seen in room STRE7/STRE7. Patient's care was started at 1337.   CSN: [id number]  Arrival date & time 07/20/11  1337   First MD Initiated Contact with Patient 07/20/11 1544      Chief Complaint  Patient presents with  . Pyelonephritis    (Consider location/radiation/quality/duration/timing/severity/associated sxs/prior treatment) HPI  Amy Noble is a 26 y.o. female who presents to the Emergency Department complaining of a kidney infection with associated dysuria, hematuria, back pain and abdominal pain. Patient has a history of kidney infections and was recently seen at [facility] yesterday and was given Rx for macrobid but could not afford. Pt says her medications change the color of her urine.    Past Medical History  Diagnosis Date  . Chronic back pain     History reviewed. No pertinent past surgical history.  History reviewed. No pertinent family history.  History  Substance Use Topics  . Smoking status: Never Smoker   . Smokeless tobacco: Not on file  . Alcohol Use: No    OB History    Grav Para Term Preterm Abortions TAB SAB Ect Mult Living                  Review of Systems  Constitutional: Positive for fever.  Gastrointestinal: Positive for abdominal pain. Negative for nausea and vomiting.  Musculoskeletal: Positive for back pain.  All other systems reviewed and are negative.    Allergies  Penicillins  Home Medications   Current Outpatient Rx  Name Route Sig Dispense Refill  . ACETAMINOPHEN 500 MG PO TABS Oral Take 1,000-1,500 mg by mouth every 6 (six) hours as needed. For headache    . IBUPROFEN 200 MG PO TABS Oral Take 200 mg by mouth every 6 (six) hours as needed. For pain    . LEVONORGESTREL 20 MCG/24HR IU IUD Intrauterine 1 each by Intrauterine route once.    Marland Kitchen PHENAZOPYRIDINE HCL 200 MG PO TABS Oral Take 200 mg by mouth 3 (three) times daily.      BP  121/80  Temp(Src) 99 F (37.2 C) (Oral)  Resp 16  Wt 180 lb (81.647 kg)  SpO2 99%  Physical Exam  Nursing note and vitals reviewed. Constitutional: She is oriented to person, place, and time. She appears well-developed and well-nourished. No distress.  HENT:  Head: Normocephalic and atraumatic.  Mouth/Throat: Oropharynx is clear and moist.  Eyes: EOM are normal.  Neck: Neck supple. No tracheal deviation present.  Cardiovascular: Normal rate.   Pulmonary/Chest: Effort normal. No respiratory distress.  Abdominal: There is tenderness (subrapubic) in the suprapubic area. There is CVA tenderness (bilateral ). There is no rebound and no guarding.  Musculoskeletal: Normal range of motion.  Neurological: She is alert and oriented to person, place, and time.  Skin: Skin is warm and dry.  Psychiatric: She has a normal mood and affect. Her behavior is normal.    ED Course  Procedures (including critical care time)  Pt seen at 1606 to have UA and CT   Labs Reviewed  URINALYSIS, ROUTINE W REFLEX MICROSCOPIC - Abnormal; Notable for the following:    Color, Urine ORANGE (*) BIOCHEMICALS MAY BE AFFECTED BY COLOR   APPearance CLOUDY (*)    Hgb urine dipstick MODERATE (*)    Bilirubin Urine SMALL (*)    Ketones, ur 15 (*)    Nitrite POSITIVE (*)    Leukocytes, UA  MODERATE (*)    All other components within normal limits  URINE MICROSCOPIC-ADD ON - Abnormal; Notable for the following:    Squamous Epithelial / LPF FEW (*)    All other components within normal limits   Ct Abdomen Pelvis Wo Contrast  07/20/2011  *RADIOLOGY REPORT*  Clinical Data: Rule out stones.  The patient was diagnosed with urinary tract infection yesterday.  Persistent UTI fell last month without resolution.  CT ABDOMEN AND PELVIS WITHOUT CONTRAST  Technique:  Multidetector CT imaging of the abdomen and pelvis was performed following the standard protocol without intravenous contrast.  Comparison: Plain films of the lumbar  spine 05/19/2011  Findings: Images of the lung bases are unremarkable.  Note is made of mild pectus deformity.  No intrarenal or ureteral calculi identified.  No focal abnormality identified within the liver, spleen, pancreas, adrenal glands, or kidneys.  The gallbladder is present.  The stomach, small bowel loops have a normal appearance.  Colonic loops are normal in appearance. The appendix is well seen and has a normal appearance.  The uterus is present and contains an intrauterine device, in the expected location.  Adnexal regions have a normal appearance by CT. No evidence for free pelvic fluid. No retroperitoneal or mesenteric adenopathy. No evidence for aortic aneurysm. Visualized osseous structures have a normal appearance.  IMPRESSION: No evidence for acute abnormality.  Original Report Authenticated By: Patterson Hammersmith, M.D.     No diagnosis found.    MDM   Pt seen yesterday and dx with a UTI.  She has had a persistent UTI for the last month without resolve of sx.  She has taken bactrim and cipro without resolve.  Seen at [facility] yesterday and given ppx for macrobid but too expensive.  She has had a neg pelvic with neg cultures recently and denies hx of KS.  However due to persistent infection will get CT to r/o stones.  Pt has not had any recent urine cx done but last one done in April was pan-sensitive except for bactrim resistance.  Will place pt on keflex as it is on the 4 dollar list. Currently not having any signs of pyelo and tolerating po's. CT is negative for stones. Discussed with patient that if the infection continues on her followup with urology for further evaluation.  I personally performed the services described in this documentation, which was scribed in my presence.  The recorded information has been reviewed and considered.       Gwyneth Sprout, MD 07/20/11 1646  Gwyneth Sprout, MD 07/20/11 1745

## 2011-07-20 NOTE — Discharge Instructions (Signed)
Urine Culture Collection, Female  You will collect a sample of pee (urine) in a cup. Read the instructions below before beginning. If you have any questions, ask the nurse before you begin. Follow the instructions carefully. 1. Wash your hands with soap and water and dry them thoroughly.  2. Open the lid of the cup. Be careful not to touch the inside.  3. Clean the private (genital) area. 1. Sit over the toilet. Use the fingers of one hand to separate and hold open the folds of the skin in your private area.  2. Clean the pee (urinary) opening and surrounding area with the gauze, wiping from front to back. Throw away the gauze in the trash, not the toilet.  3. Repeat step "b"2 more times.  4. With the folds of skin still separated, pee a small amount into toilet. STOP, then pee into the cup. Fill the cup half way.  5. Put the lid on the cup tightly.  6. Wash your hands with soap and water.  7. If you were given a label, put the label on the cup.  8. Give the cup to the nurse.  Document Released: 01/23/2008 Document Revised: 01/29/2011 Document Reviewed: 01/23/2008 [employer] Patient Information 2012 [employer] 

## 2011-07-20 NOTE — ED Notes (Signed)
Pt sts here x 4 for kidney infection and was seen yesterday at [facility] and given RX for macrobid but can not afford; pt c/o pain in abd and pain with urination

## 2011-07-23 LAB — URINE CULTURE

## 2013-04-05 ENCOUNTER — Emergency Department (HOSPITAL_COMMUNITY)
Admission: EM | Admit: 2013-04-05 | Discharge: 2013-04-06 | Disposition: A | Discharge status: Home / Self Care | Payer: Medicaid Other | Attending: Emergency Medicine | Admitting: Emergency Medicine

## 2013-04-05 DIAGNOSIS — R197 Diarrhea, unspecified: Secondary | ICD-10-CM | POA: Insufficient documentation

## 2013-04-05 DIAGNOSIS — R112 Nausea with vomiting, unspecified: Secondary | ICD-10-CM | POA: Insufficient documentation

## 2013-04-05 DIAGNOSIS — Z880 Allergy status to penicillin: Secondary | ICD-10-CM | POA: Insufficient documentation

## 2013-04-05 DIAGNOSIS — A499 Bacterial infection, unspecified: Secondary | ICD-10-CM | POA: Insufficient documentation

## 2013-04-05 DIAGNOSIS — R109 Unspecified abdominal pain: Secondary | ICD-10-CM

## 2013-04-05 DIAGNOSIS — G8929 Other chronic pain: Secondary | ICD-10-CM | POA: Insufficient documentation

## 2013-04-05 DIAGNOSIS — A5901 Trichomonal vulvovaginitis: Secondary | ICD-10-CM

## 2013-04-05 DIAGNOSIS — Z3202 Encounter for pregnancy test, result negative: Secondary | ICD-10-CM | POA: Insufficient documentation

## 2013-04-05 DIAGNOSIS — N760 Acute vaginitis: Secondary | ICD-10-CM | POA: Insufficient documentation

## 2013-04-05 DIAGNOSIS — B9689 Other specified bacterial agents as the cause of diseases classified elsewhere: Secondary | ICD-10-CM | POA: Insufficient documentation

## 2013-04-05 DIAGNOSIS — Z87440 Personal history of urinary (tract) infections: Secondary | ICD-10-CM | POA: Insufficient documentation

## 2013-04-06 ENCOUNTER — Emergency Department (HOSPITAL_COMMUNITY): Payer: Medicaid Other

## 2013-04-06 ENCOUNTER — Encounter (HOSPITAL_COMMUNITY): Payer: Self-pay | Admitting: Emergency Medicine

## 2013-04-06 LAB — COMPREHENSIVE METABOLIC PANEL
ALT: 17 U/L (ref 0–35)
AST: 21 U/L (ref 0–37)
Albumin: 3.6 g/dL (ref 3.5–5.2)
Alkaline Phosphatase: 77 U/L (ref 39–117)
BUN: 11 mg/dL (ref 6–23)
CALCIUM: 8.8 mg/dL (ref 8.4–10.5)
CO2: 27 meq/L (ref 19–32)
Chloride: 98 mEq/L (ref 96–112)
Creatinine, Ser: 0.73 mg/dL (ref 0.50–1.10)
GFR calc Af Amer: >90 mL/min (ref >90)
GFR calc non Af Amer: >90 mL/min (ref >90)
Glucose, Bld: 88 mg/dL (ref 70–99)
POTASSIUM: 3.5 meq/L — AB (ref 3.7–5.3)
SODIUM: 135 meq/L — AB (ref 137–147)
TOTAL PROTEIN: 6.8 g/dL (ref 6.0–8.3)
Total Bilirubin: 0.3 mg/dL (ref 0.3–1.2)

## 2013-04-06 LAB — CBC WITH DIFFERENTIAL/PLATELET
BASOS ABS: 0 10*3/uL (ref 0.0–0.1)
Basophils Relative: 0 % (ref 0–1)
EOS ABS: 0.2 10*3/uL (ref 0.0–0.7)
EOS PCT: 2 % (ref 0–5)
HCT: 33.6 % — ABNORMAL LOW (ref 36.0–46.0)
Hemoglobin: 11.9 g/dL — ABNORMAL LOW (ref 12.0–15.0)
LYMPHS PCT: 28 % (ref 12–46)
Lymphs Abs: 2 10*3/uL (ref 0.7–4.0)
MCH: 32.8 pg (ref 26.0–34.0)
MCHC: 35.4 g/dL (ref 30.0–36.0)
MCV: 92.6 fL (ref 78.0–100.0)
Monocytes Absolute: 0.7 10*3/uL (ref 0.1–1.0)
Monocytes Relative: 10 % (ref 3–12)
NEUTROS PCT: 60 % (ref 43–77)
Neutro Abs: 4.2 10*3/uL (ref 1.7–7.7)
PLATELETS: 163 10*3/uL (ref 150–400)
RBC: 3.63 MIL/uL — ABNORMAL LOW (ref 3.87–5.11)
RDW: 11.8 % (ref 11.5–15.5)
WBC: 7.1 10*3/uL (ref 4.0–10.5)

## 2013-04-06 LAB — URINALYSIS, ROUTINE W REFLEX MICROSCOPIC
Bilirubin Urine: NEGATIVE
GLUCOSE, UA: NEGATIVE mg/dL
HGB URINE DIPSTICK: NEGATIVE
Ketones, ur: NEGATIVE mg/dL
Nitrite: NEGATIVE
Protein, ur: NEGATIVE mg/dL
SPECIFIC GRAVITY, URINE: 1.012 (ref 1.005–1.030)
Urobilinogen, UA: 0.2 mg/dL (ref 0.0–1.0)
pH: 7.5 (ref 5.0–8.0)

## 2013-04-06 LAB — URINE MICROSCOPIC-ADD ON

## 2013-04-06 LAB — GC/CHLAMYDIA PROBE AMP
CT PROBE, AMP APTIMA: NEGATIVE
GC PROBE AMP APTIMA: NEGATIVE

## 2013-04-06 LAB — LIPASE, BLOOD: Lipase: 20 U/L (ref 11–59)

## 2013-04-06 LAB — WET PREP, GENITAL: YEAST WET PREP: NONE SEEN

## 2013-04-06 LAB — POCT PREGNANCY, URINE: PREG TEST UR: NEGATIVE

## 2013-04-06 MED ORDER — AZITHROMYCIN 250 MG PO TABS
1000.0000 mg | ORAL_TABLET | Freq: Once | ORAL | Status: AC
  Administered 2013-04-06: 1000 mg via ORAL
  Filled 2013-04-06: qty 4

## 2013-04-06 MED ORDER — METRONIDAZOLE 500 MG PO TABS: 500.0000 mg | ORAL_TABLET | Freq: Two times a day (BID) | ORAL | Status: DC

## 2013-04-06 MED ORDER — CEFTRIAXONE SODIUM 250 MG IJ SOLR
250.0000 mg | Freq: Once | INTRAMUSCULAR | Status: AC
  Administered 2013-04-06: 250 mg via INTRAMUSCULAR
  Filled 2013-04-06: qty 250

## 2013-04-06 MED ORDER — KETOROLAC TROMETHAMINE 30 MG/ML IJ SOLN
30.0000 mg | Freq: Once | INTRAMUSCULAR | Status: AC
  Administered 2013-04-06: 30 mg via INTRAVENOUS
  Filled 2013-04-06: qty 1

## 2013-04-06 NOTE — Discharge Instructions (Signed)
Take antibiotic to completion as directed. This antibiotic will treat both Trichomonas and bacterial vaginosis. You were also treated today for gonorrhea and Chlamydia, if these tests result positive, you will be informed and are obligated to inform your partner.  Abdominal Pain, Women Abdominal (stomach, pelvic, or belly) pain can be caused by many things. It is important to tell your doctor:  The location of the pain.  Does it come and go or is it present all the time?  Are there things that start the pain (eating certain foods, exercise)?  Are there other symptoms associated with the pain (fever, nausea, vomiting, diarrhea)? All of this is helpful to know when trying to find the cause of the pain. CAUSES   Stomach: virus or bacteria infection, or ulcer.  Intestine: appendicitis (inflamed appendix), regional ileitis (Crohn's disease), ulcerative colitis (inflamed colon), irritable bowel syndrome, diverticulitis (inflamed diverticulum of the colon), or cancer of the stomach or intestine.  Gallbladder disease or stones in the gallbladder.  Kidney disease, kidney stones, or infection.  Pancreas infection or cancer.  Fibromyalgia (pain disorder).  Diseases of the female organs:  Uterus: fibroid (non-cancerous) tumors or infection.  Fallopian tubes: infection or tubal pregnancy.  Ovary: cysts or tumors.  Pelvic adhesions (scar tissue).  Endometriosis (uterus lining tissue growing in the pelvis and on the pelvic organs).  Pelvic congestion syndrome (female organs filling up with blood just before the menstrual period).  Pain with the menstrual period.  Pain with ovulation (producing an egg).  Pain with an IUD (intrauterine device, birth control) in the uterus.  Cancer of the female organs.  Functional pain (pain not caused by a disease, may improve without treatment).  Psychological pain.  Depression. DIAGNOSIS  Your doctor will decide the seriousness of your pain  by doing an examination.  Blood tests.  X-rays.  Ultrasound.  CT scan (computed tomography, special type of X-ray).  MRI (magnetic resonance imaging).  Cultures, for infection.  Barium enema (dye inserted in the large intestine, to better view it with X-rays).  Colonoscopy (looking in intestine with a lighted tube).  Laparoscopy (minor surgery, looking in abdomen with a lighted tube).  Major abdominal exploratory surgery (looking in abdomen with a large incision). TREATMENT  The treatment will depend on the cause of the pain.   Many cases can be observed and treated at home.  Over-the-counter medicines recommended by your caregiver.  Prescription medicine.  Antibiotics, for infection.  Birth control pills, for painful periods or for ovulation pain.  Hormone treatment, for endometriosis.  Nerve blocking injections.  Physical therapy.  Antidepressants.  Counseling with a psychologist or psychiatrist.  Minor or major surgery. HOME CARE INSTRUCTIONS   Do not take laxatives, unless directed by your caregiver.  Take over-the-counter pain medicine only if ordered by your caregiver. Do not take aspirin because it can cause an upset stomach or bleeding.  Try a clear liquid diet (broth or water) as ordered by your caregiver. Slowly move to a bland diet, as tolerated, if the pain is related to the stomach or intestine.  Have a thermometer and take your temperature several times a day, and record it.  Bed rest and sleep, if it helps the pain.  Avoid sexual intercourse, if it causes pain.  Avoid stressful situations.  Keep your follow-up appointments and tests, as your caregiver orders.  If the pain does not go away with medicine or surgery, you may try:  Acupuncture.  Relaxation exercises (yoga, meditation).  Group therapy.  Counseling. SEEK MEDICAL CARE IF:   You notice certain foods cause stomach pain.  Your home care treatment is not helping your  pain.  You need stronger pain medicine.  You want your IUD removed.  You feel faint or lightheaded.  You develop nausea and vomiting.  You develop a rash.  You are having side effects or an allergy to your medicine. SEEK IMMEDIATE MEDICAL CARE IF:   Your pain does not go away or gets worse.  You have a fever.  Your pain is felt only in portions of the abdomen. The right side could possibly be appendicitis. The left lower portion of the abdomen could be colitis or diverticulitis.  You are passing blood in your stools (bright red or black tarry stools, with or without vomiting).  You have blood in your urine.  You develop chills, with or without a fever.  You pass out. MAKE SURE YOU:   Understand these instructions.  Will watch your condition.  Will get help right away if you are not doing well or get worse. Document Released: 12/07/2006 Document Revised: 05/04/2011 Document Reviewed: 12/27/2008 [facility] Patient Information 2014 [place], [place].  Bacterial Vaginosis Bacterial vaginosis is an infection of the vagina. It happens when too many of certain germs (bacteria) grow in the vagina. HOME CARE  Take your medicine as told by your doctor.  Finish your medicine even if you start to feel better.  Do not have sex until you finish your medicine and are better.  Tell your sex partner that you have an infection. They should see their doctor for treatment.  Practice safe sex. Use condoms. Have only one sex partner. GET HELP IF:  You are not getting better after 3 days of treatment.  You have more grey fluid (discharge) coming from your vagina than before.  You have more pain than before.  You have a fever. MAKE SURE YOU:   Understand these instructions.  Will watch your condition.  Will get help right away if you are not doing well or get worse. Document Released: 11/19/2007 Document Revised: 11/30/2012 Document Reviewed: 09/21/2012 [facility] Patient  Information 2014 [place], [place].  Sexually Transmitted Disease A sexually transmitted disease (STD) is a disease or infection that may be passed (transmitted) from person to person, usually during sexual activity. This may happen by way of saliva, semen, blood, vaginal mucus, or urine. Common STDs include:   Gonorrhea.   Chlamydia.   Syphilis.   HIV and AIDS.   Genital herpes.   Hepatitis B and C.   Trichomonas.   Human papillomavirus (HPV).   Pubic lice.   Scabies.  Mites.  Bacterial vaginosis. WHAT ARE CAUSES OF STDs? An STD may be caused by bacteria, a virus, or parasites. STDs are often transmitted during sexual activity if one person is infected. However, they may also be transmitted through nonsexual means. STDs may be transmitted after:   Sexual intercourse with an infected person.   Sharing sex toys with an infected person.   Sharing needles with an infected person or using unclean piercing or tattoo needles.  Having intimate contact with the genitals, mouth, or rectal areas of an infected person.   Exposure to infected fluids during birth. WHAT ARE THE SIGNS AND SYMPTOMS OF STDs? Different STDs have different symptoms. Some people may not have any symptoms. If symptoms are present, they may include:   Painful or bloody urination.   Pain in the pelvis, abdomen, vagina, anus, throat, or eyes.   Skin  rash, itching, irritation, growths, sores (lesions), ulcerations, or warts in the genital or anal area.  Abnormal vaginal discharge with or without bad odor.   Penile discharge in men.   Fever.   Pain or bleeding during sexual intercourse.   Swollen glands in the groin area.   Yellow skin and eyes (jaundice). This is seen with hepatitis.   Swollen testicles.  Infertility.  Sores and blisters in the mouth. HOW ARE STDs DIAGNOSED? To make a diagnosis, your health care provider may:   Take a medical history.   Perform a  physical exam.   Take a sample of any discharge for examination.  Swab the throat, cervix, opening to the penis, rectum, or vagina for testing.  Test a sample of your first morning urine.   Perform blood tests.   Perform a Pap smear, if this applies.   Perform a colposcopy.   Perform a laparoscopy.  HOW ARE STDs TREATED? Treatment depends on the STD. Some STDs may be treated but not cured.   Chlamydia, gonorrhea, trichomonas, and syphilis can be cured with antibiotics.   Genital herpes, hepatitis, and HIV can be treated, but not cured, with prescribed medicines. The medicines lessen symptoms.   Genital warts from HPV can be treated with medicine or by freezing, burning (electrocautery), or surgery. Warts may come back.   HPV cannot be cured with medicine or surgery. However, abnormal areas may be removed from the cervix, vagina, or vulva.   If your diagnosis is confirmed, your recent sexual partners need treatment. This is true even if they are symptom-free or have a negative culture or evaluation. They should not have sex until their health care providers say it is OK. HOW CAN I REDUCE MY RISK OF GETTING AN STD?  Use latex condoms, dental dams, and water-soluble lubricants during sexual activity. Do not use petroleum jelly or oils.  Get vaccinated for HPV and hepatitis. If you have not received these vaccines in the past, talk to your health care provider about whether one or both might be right for you.   Avoid risky sex practices that can break the skin.  WHAT SHOULD I DO IF I THINK I HAVE AN STD?  See your health care provider.   Inform all sexual partners. They should be tested and treated for any STDs.  Do not have sex until your health care provider says it is OK. WHEN SHOULD I GET HELP? Seek immediate medical care if:  You develop severe abdominal pain.  You are a man and notice swelling or pain in the testicles.  You are a woman and notice  swelling or pain in your vagina. Document Released: 05/02/2002 Document Revised: 11/30/2012 Document Reviewed: 08/30/2012 [facility] Patient Information 2014 [place], [place].  Trichomoniasis Trichomoniasis is an infection, caused by the Trichomonas organism, that affects both women and men. In women, the outer female genitalia and the vagina are affected. In men, the penis is mainly affected, but the prostate and other reproductive organs can also be involved. Trichomoniasis is a sexually transmitted disease (STD) and is most often passed to another person through sexual contact. The majority of people who get trichomoniasis do so from a sexual encounter and are also at risk for other STDs. CAUSES   Sexual intercourse with an infected partner.  It can be present in swimming pools or hot tubs. SYMPTOMS   Abnormal gray-green frothy vaginal discharge in women.  Vaginal itching and irritation in women.  Itching and irritation of the area  outside the vagina in women.  Penile discharge with or without pain in males.  Inflammation of the urethra (urethritis), causing painful urination.  Bleeding after sexual intercourse. RELATED COMPLICATIONS  Pelvic inflammatory disease.  Infection of the uterus (endometritis).  Infertility.  Tubal (ectopic) pregnancy.  It can be associated with other STDs, including gonorrhea and chlamydia, hepatitis B, and HIV. COMPLICATIONS DURING PREGNANCY  Early (premature) delivery.  Premature rupture of the membranes (PROM).  Low birth weight. DIAGNOSIS   Visualization of Trichomonas under the microscope from the vagina discharge.  Ph of the vagina greater than 4.5, tested with a test tape.  Trich Rapid Test.  Culture of the organism, but this is not usually needed.  It may be found on a Pap test.  Having a "strawberry cervix,"which means the cervix looks very red like a strawberry. TREATMENT   You may be given medication to fight the  infection. Inform your caregiver if you could be or are pregnant. Some medications used to treat the infection should not be taken during pregnancy.  Over-the-counter medications or creams to decrease itching or irritation may be recommended.  Your sexual partner will need to be treated if infected. HOME CARE INSTRUCTIONS   Take all medication prescribed by your caregiver.  Take over-the-counter medication for itching or irritation as directed by your caregiver.  Do not have sexual intercourse while you have the infection.  Do not douche or wear tampons.  Discuss your infection with your partner, as your partner may have acquired the infection from you. Or, your partner may have been the person who transmitted the infection to you.  Have your sex partner examined and treated if necessary.  Practice safe, informed, and protected sex.  See your caregiver for other STD testing. SEEK MEDICAL CARE IF:   You still have symptoms after you finish the medication.  You have an oral temperature above 102 F (38.9 C).  You develop belly (abdominal) pain.  You have pain when you urinate.  You have bleeding after sexual intercourse.  You develop a rash.  The medication makes you sick or makes you throw up (vomit). Document Released: 08/05/2000 Document Revised: 05/04/2011 Document Reviewed: 08/31/2008 [facility] Patient Information 2014 [place], [place].

## 2013-04-06 NOTE — ED Provider Notes (Signed)
CSN: [id number]     Arrival date & time 04/05/13  2355 History   First MD Initiated Contact with Patient 04/06/13 0103     Chief Complaint  Patient presents with  . Abdominal Pain     (Consider location/radiation/quality/duration/timing/severity/associated sxs/prior Treatment) HPI Comments: Patient is a 28 year old female with a past medical history of chronic back pain who presents to the emergency department complaining of gradual onset lower abdominal pain beginning around 10:00 last night. Pain is sharp, constant, nonradiating worse with walking. She has not tried any alleviating factors for her symptoms. Admits to a few episodes of vomiting and diarrhea 2 days ago. Denies increased urinary frequency, urgency, dysuria, hematuria, vaginal bleeding or discharge, fever or chills. States she has a history of urinary tract infections, however this feels different. States she has an IUD, however this has been in for 5 years and is due to come out this month.  Patient is a 28 y.o. female presenting with abdominal pain. The history is provided by the patient.  Abdominal Pain Associated symptoms: diarrhea, nausea and vomiting     Past Medical History  Diagnosis Date  . Chronic back pain    History reviewed. No pertinent past surgical history. No family history on file. History  Substance Use Topics  . Smoking status: Never Smoker   . Smokeless tobacco: Not on file  . Alcohol Use: Yes     Comment: occ   OB History   Grav Para Term Preterm Abortions TAB SAB Ect Mult Living                 Review of Systems  Gastrointestinal: Positive for nausea, vomiting, abdominal pain and diarrhea.  All other systems reviewed and are negative.      Allergies  Penicillins  Home Medications   Current Outpatient Rx  Name  Route  Sig  Dispense  Refill  . levonorgestrel (MIRENA) 20 MCG/24HR IUD   Intrauterine   1 each by Intrauterine route once.         . metroNIDAZOLE (FLAGYL) 500 MG  tablet   Oral   Take 1 tablet (500 mg total) by mouth 2 (two) times daily. One po bid x 7 days   14 tablet   0    BP 121/72  Pulse 107  Temp(Src) 98.6 F (37 C) (Oral)  Resp 20  Ht 5\' 2"  (1.575 m)  Wt 180 lb (81.647 kg)  BMI 32.91 kg/m2  SpO2 100% Physical Exam  Nursing note and vitals reviewed. Constitutional: She is oriented to person, place, and time. She appears well-developed and well-nourished. No distress.  HENT:  Head: Normocephalic and atraumatic.  Mouth/Throat: Oropharynx is clear and moist.  Eyes: Conjunctivae are normal.  Neck: Normal range of motion. Neck supple.  Cardiovascular: Normal rate, regular rhythm and normal heart sounds.   Pulmonary/Chest: Effort normal and breath sounds normal.  Abdominal: Soft. Normal appearance and bowel sounds are normal. She exhibits no distension. There is tenderness in the suprapubic area. There is no rigidity, no rebound and no guarding.  No peritoneal signs.  Genitourinary: Uterus normal. Cervix exhibits no motion tenderness, no discharge and no friability. Right adnexum displays no mass, no tenderness and no fullness. Left adnexum displays no mass, no tenderness and no fullness. No erythema, tenderness or bleeding around the vagina. Vaginal discharge (scant, white) found.  Musculoskeletal: Normal range of motion. She exhibits no edema.  Neurological: She is alert and oriented to person, place, and time.  Skin: Skin  is warm and dry. She is not diaphoretic.  Psychiatric: She has a normal mood and affect. Her behavior is normal.    ED Course  Procedures (including critical care time) Labs Review Labs Reviewed  WET PREP, GENITAL - Abnormal; Notable for the following:    Trich, Wet Prep RARE (*)    Clue Cells Wet Prep HPF POC RARE (*)    WBC, Wet Prep HPF POC FEW (*)    All other components within normal limits  CBC WITH DIFFERENTIAL - Abnormal; Notable for the following:    RBC 3.63 (*)    Hemoglobin 11.9 (*)    HCT 33.6  (*)    All other components within normal limits  COMPREHENSIVE METABOLIC PANEL - Abnormal; Notable for the following:    Sodium 135 (*)    Potassium 3.5 (*)    All other components within normal limits  URINALYSIS, ROUTINE W REFLEX MICROSCOPIC - Abnormal; Notable for the following:    Leukocytes, UA SMALL (*)    All other components within normal limits  URINE MICROSCOPIC-ADD ON - Abnormal; Notable for the following:    Squamous Epithelial / LPF FEW (*)    Bacteria, UA FEW (*)    All other components within normal limits  GC/CHLAMYDIA PROBE AMP  LIPASE, BLOOD  POCT PREGNANCY, URINE   Imaging Review [place] Transvaginal Non-ob  04/06/2013   CLINICAL DATA:  Pelvic pain  EXAM: TRANSABDOMINAL ULTRASOUND OF PELVIS  TECHNIQUE: Transabdominal ultrasound examination of the pelvis was performed including evaluation of the uterus, ovaries, adnexal regions, and pelvic cul-de-sac.  COMPARISON:  None.  FINDINGS: Uterus  Measurements: 9.5 x 4.4 x 5.1 cm. No fibroids or other mass visualized. An IUD is identified in proper position.  Endometrium  Thickness: Difficult to assess due to the presence of IUD but is normal. No focal abnormality visualized.  Right ovary  Measurements: 3.3 x 2.7 x 2 cm. Normal appearance/no adnexal mass. Positive arterial and venous flow identified.  Left ovary  Measurements: 4.9 x 2 x 2.2 cm. There is a 2.4 x 1.5 x 1.9 cm cyst in the left ovary. Positive arterial and venous flow identified.  Other findings:  Trace free fluid identified in the pelvis.  IMPRESSION: No acute abnormality identified.   Electronically Signed   By: Sherian ReinWei-Chen  Lin M.D.   On: 04/06/2013 02:14   [place] Pelvis Complete  04/06/2013   CLINICAL DATA:  Pelvic pain  EXAM: TRANSABDOMINAL ULTRASOUND OF PELVIS  TECHNIQUE: Transabdominal ultrasound examination of the pelvis was performed including evaluation of the uterus, ovaries, adnexal regions, and pelvic cul-de-sac.  COMPARISON:  None.  FINDINGS: Uterus  Measurements: 9.5  x 4.4 x 5.1 cm. No fibroids or other mass visualized. An IUD is identified in proper position.  Endometrium  Thickness: Difficult to assess due to the presence of IUD but is normal. No focal abnormality visualized.  Right ovary  Measurements: 3.3 x 2.7 x 2 cm. Normal appearance/no adnexal mass. Positive arterial and venous flow identified.  Left ovary  Measurements: 4.9 x 2 x 2.2 cm. There is a 2.4 x 1.5 x 1.9 cm cyst in the left ovary. Positive arterial and venous flow identified.  Other findings:  Trace free fluid identified in the pelvis.  IMPRESSION: No acute abnormality identified.   Electronically Signed   By: Sherian ReinWei-Chen  Lin M.D.   On: 04/06/2013 02:14   [place] Art/ven Flow Abd Pelv Doppler  04/06/2013   CLINICAL DATA:  Pelvic pain  EXAM: TRANSABDOMINAL ULTRASOUND OF  PELVIS  TECHNIQUE: Transabdominal ultrasound examination of the pelvis was performed including evaluation of the uterus, ovaries, adnexal regions, and pelvic cul-de-sac.  COMPARISON:  None.  FINDINGS: Uterus  Measurements: 9.5 x 4.4 x 5.1 cm. No fibroids or other mass visualized. An IUD is identified in proper position.  Endometrium  Thickness: Difficult to assess due to the presence of IUD but is normal. No focal abnormality visualized.  Right ovary  Measurements: 3.3 x 2.7 x 2 cm. Normal appearance/no adnexal mass. Positive arterial and venous flow identified.  Left ovary  Measurements: 4.9 x 2 x 2.2 cm. There is a 2.4 x 1.5 x 1.9 cm cyst in the left ovary. Positive arterial and venous flow identified.  Other findings:  Trace free fluid identified in the pelvis.  IMPRESSION: No acute abnormality identified.   Electronically Signed   By: Sherian Rein M.D.   On: 04/06/2013 02:14    EKG Interpretation   None       MDM   Final diagnoses:  Abdominal pain  BV (bacterial vaginosis)  Trichomonas vaginitis    Pt presenting with lower abdominal pain. She is well appearing, NAD, afebrile, VSS. Suprapubic tenderness on exam, no peritoneal  signs. Patient is concerned about her IUD being in for to long period to associated vaginal symptoms. Will do pelvic exam and obtain pelvic ultrasound.  3:09 AM Wet prep showing trichomoniasis and clue cells. Will treat with Flagyl for both. Patient will also be treated prophylactically for GC Chlamydia. Ultrasound was normal, IUD normally in place. Pain beginning to improve with toradol. Repeat abdominal exam, her abdomen still with tenderness, clinical improvement from initial evaluation. Patient is stable for discharge home, followup with gynecologist. Return precautions given. Patient states understanding of treatment care plan and is agreeable.   Trevor Mace, PA-C 04/06/13 [phone]

## 2013-04-06 NOTE — ED Notes (Signed)
Ultrasound at bedside

## 2013-04-06 NOTE — ED Provider Notes (Signed)
Medical screening examination/treatment/procedure(s) were performed by non-physician practitioner and as supervising physician I was immediately available for consultation/collaboration.    Tyrae Alcoser, MD 04/06/13 0631 

## 2013-04-06 NOTE — ED Notes (Signed)
Pt c/o low mid abd pain onset 2200 tonight. Denies vaginal d/c or bleeding, denies dysuria. Pt has IUD

## 2013-06-28 ENCOUNTER — Encounter (HOSPITAL_COMMUNITY): Payer: Self-pay | Admitting: Emergency Medicine

## 2013-06-28 ENCOUNTER — Emergency Department (HOSPITAL_COMMUNITY)
Admission: EM | Admit: 2013-06-28 | Discharge: 2013-06-28 | Disposition: A | Discharge status: Home / Self Care | Payer: No Typology Code available for payment source | Attending: Emergency Medicine | Admitting: Emergency Medicine

## 2013-06-28 DIAGNOSIS — Z3202 Encounter for pregnancy test, result negative: Secondary | ICD-10-CM | POA: Insufficient documentation

## 2013-06-28 DIAGNOSIS — Z880 Allergy status to penicillin: Secondary | ICD-10-CM | POA: Insufficient documentation

## 2013-06-28 DIAGNOSIS — G8929 Other chronic pain: Secondary | ICD-10-CM | POA: Insufficient documentation

## 2013-06-28 DIAGNOSIS — N390 Urinary tract infection, site not specified: Secondary | ICD-10-CM

## 2013-06-28 LAB — URINALYSIS, ROUTINE W REFLEX MICROSCOPIC
BILIRUBIN URINE: NEGATIVE
GLUCOSE, UA: NEGATIVE mg/dL
KETONES UR: NEGATIVE mg/dL
NITRITE: POSITIVE — AB
Protein, ur: 100 mg/dL — AB
Specific Gravity, Urine: 1.025 (ref 1.005–1.030)
Urobilinogen, UA: 0.2 mg/dL (ref 0.0–1.0)
pH: 5.5 (ref 5.0–8.0)

## 2013-06-28 LAB — URINE MICROSCOPIC-ADD ON

## 2013-06-28 LAB — POC URINE PREG, ED: Preg Test, Ur: NEGATIVE

## 2013-06-28 MED ORDER — PHENAZOPYRIDINE HCL 200 MG PO TABS
200.0000 mg | ORAL_TABLET | Freq: Three times a day (TID) | ORAL | Status: DC
  Administered 2013-06-28: 200 mg via ORAL
  Filled 2013-06-28: qty 1

## 2013-06-28 MED ORDER — PHENAZOPYRIDINE HCL 200 MG PO TABS: 200.0000 mg | ORAL_TABLET | Freq: Three times a day (TID) | ORAL | Status: DC

## 2013-06-28 MED ORDER — CIPROFLOXACIN HCL 500 MG PO TABS
500.0000 mg | ORAL_TABLET | Freq: Once | ORAL | Status: AC
  Administered 2013-06-28: 500 mg via ORAL
  Filled 2013-06-28: qty 1

## 2013-06-28 MED ORDER — NAPROXEN 500 MG PO TABS: 500.0000 mg | ORAL_TABLET | Freq: Two times a day (BID) | ORAL | Status: DC

## 2013-06-28 MED ORDER — CIPROFLOXACIN HCL 500 MG PO TABS: 500.0000 mg | ORAL_TABLET | Freq: Two times a day (BID) | ORAL | Status: DC

## 2013-06-28 NOTE — ED Notes (Signed)
Urine POC is negative. 

## 2013-06-28 NOTE — Discharge Instructions (Signed)
Take medications as prescribed.  Increase your fluid intake.  Drinking cranberry juice may help with symptoms as well.  Return to the ER for signs of worsening infection:  Fever, vomiting, worsening pain.   Urinary Tract Infection Urinary tract infections (UTIs) can develop anywhere along your urinary tract. Your urinary tract is your body's drainage system for removing wastes and extra water. Your urinary tract includes two kidneys, two ureters, a bladder, and a urethra. Your kidneys are a pair of bean-shaped organs. Each kidney is about the size of your fist. They are located below your ribs, one on each side of your spine. CAUSES Infections are caused by microbes, which are microscopic organisms, including fungi, viruses, and bacteria. These organisms are so small that they can only be seen through a microscope. Bacteria are the microbes that most commonly cause UTIs. SYMPTOMS  Symptoms of UTIs may vary by age and gender of the patient and by the location of the infection. Symptoms in young women typically include a frequent and intense urge to urinate and a painful, burning feeling in the bladder or urethra during urination. Older women and men are more likely to be tired, shaky, and weak and have muscle aches and abdominal pain. A fever may mean the infection is in your kidneys. Other symptoms of a kidney infection include pain in your back or sides below the ribs, nausea, and vomiting. DIAGNOSIS To diagnose a UTI, your caregiver will ask you about your symptoms. Your caregiver also will ask to provide a urine sample. The urine sample will be tested for bacteria and white blood cells. White blood cells are made by your body to help fight infection. TREATMENT  Typically, UTIs can be treated with medication. Because most UTIs are caused by a bacterial infection, they usually can be treated with the use of antibiotics. The choice of antibiotic and length of treatment depend on your symptoms and the  type of bacteria causing your infection. HOME CARE INSTRUCTIONS  If you were prescribed antibiotics, take them exactly as your caregiver instructs you. Finish the medication even if you feel better after you have only taken some of the medication.  Drink enough water and fluids to keep your urine clear or pale yellow.  Avoid caffeine, tea, and carbonated beverages. They tend to irritate your bladder.  Empty your bladder often. Avoid holding urine for long periods of time.  Empty your bladder before and after sexual intercourse.  After a bowel movement, women should cleanse from front to back. Use each tissue only once. SEEK MEDICAL CARE IF:   You have back pain.  You develop a fever.  Your symptoms do not begin to resolve within 3 days. SEEK IMMEDIATE MEDICAL CARE IF:   You have severe back pain or lower abdominal pain.  You develop chills.  You have nausea or vomiting.  You have continued burning or discomfort with urination. MAKE SURE YOU:   Understand these instructions.  Will watch your condition.  Will get help right away if you are not doing well or get worse. Document Released: 11/19/2004 Document Revised: 08/11/2011 Document Reviewed: 03/20/2011 [facility] Patient Information 2014 [employer], [place].

## 2013-06-28 NOTE — ED Notes (Signed)
Pt states she has lower abd pain and pain in her lower back  Pt states the pain started 3 days ago but got worse about 7pm last night  Pt states she has urinary frequency and dysuria

## 2013-06-28 NOTE — ED Provider Notes (Signed)
CSN: [id number]     Arrival date & time 06/28/13  0448 History   First MD Initiated Contact with Patient 06/28/13 [id number]     Chief Complaint  Patient presents with  . Abdominal Pain     (Consider location/radiation/quality/duration/timing/severity/associated sxs/prior Treatment) HPI 28 yo female presents to the ER with complaint of suprapubic pain and dysuria.  Sxs started 3 days ago, but have been worsening.  Pt with prior h/o uti and pyelo.  No fevers, no n/v.  No recent sexual activity, no vaginal d/c.  History of chronic low back pain, today has some slight worsening to back pain.  Took ibuprofen prior to arrival without improvement.  Does not have pcm. Past Medical History  Diagnosis Date  . Chronic back pain    History reviewed. No pertinent past surgical history. History reviewed. No pertinent family history. History  Substance Use Topics  . Smoking status: Never Smoker   . Smokeless tobacco: Not on file  . Alcohol Use: Yes     Comment: occ   OB History   Grav Para Term Preterm Abortions TAB SAB Ect Mult Living                 Review of Systems  All other systems reviewed and are negative.     Allergies  Penicillins  Home Medications   Prior to Admission medications   Medication Sig Start Date End Date Taking? Authorizing Provider  ciprofloxacin (CIPRO) 500 MG tablet Take 1 tablet (500 mg total) by mouth 2 (two) times daily. 06/28/13   Olivia Mackielga M Leili Eskenazi, MD  levonorgestrel (MIRENA) 20 MCG/24HR IUD 1 each by Intrauterine route once.    Historical Provider, MD  metroNIDAZOLE (FLAGYL) 500 MG tablet Take 1 tablet (500 mg total) by mouth 2 (two) times daily. One po bid x 7 days 04/06/13   Trevor Maceobyn M Albert, PA-C  naproxen (NAPROSYN) 500 MG tablet Take 1 tablet (500 mg total) by mouth 2 (two) times daily. 06/28/13   Olivia Mackielga M Zykerria Tanton, MD  phenazopyridine (PYRIDIUM) 200 MG tablet Take 1 tablet (200 mg total) by mouth 3 (three) times daily. 06/28/13   Olivia Mackielga M Yutaka Holberg, MD   BP 122/67  Pulse 86   Temp(Src) 97.7 F (36.5 C) (Oral)  Resp 20  Ht 5\' 3"  (1.6 m)  Wt 180 lb (81.647 kg)  BMI 31.89 kg/m2  SpO2 100% Physical Exam  Nursing note and vitals reviewed. Constitutional: She is oriented to person, place, and time. She appears well-developed and well-nourished.  Well appearing, nontoxic  HENT:  Head: Normocephalic and atraumatic.  Nose: Nose normal.  Mouth/Throat: Oropharynx is clear and moist.  Eyes: Conjunctivae and EOM are normal. Pupils are equal, round, and reactive to light.  Neck: Normal range of motion. Neck supple. No JVD present. No tracheal deviation present. No thyromegaly present.  Cardiovascular: Normal rate, regular rhythm, normal heart sounds and intact distal pulses.  Exam reveals no gallop and no friction rub.   No murmur heard. Pulmonary/Chest: Effort normal and breath sounds normal. No stridor. No respiratory distress. She has no wheezes. She has no rales. She exhibits no tenderness.  Abdominal: Soft. Bowel sounds are normal. She exhibits no distension and no mass. There is tenderness (mild suprapubic tenderness). There is no rebound and no guarding.  Musculoskeletal: Normal range of motion. She exhibits no edema and no tenderness.  Lymphadenopathy:    She has no cervical adenopathy.  Neurological: She is alert and oriented to person, place, and time. She exhibits  normal muscle tone. Coordination normal.  Skin: Skin is warm and dry. No rash noted. No erythema. No pallor.  Psychiatric: She has a normal mood and affect. Her behavior is normal. Judgment and thought content normal.    ED Course  Procedures (including critical care time) Labs Review Labs Reviewed  URINALYSIS, ROUTINE W REFLEX MICROSCOPIC - Abnormal; Notable for the following:    APPearance TURBID (*)    Hgb urine dipstick MODERATE (*)    Protein, ur 100 (*)    Nitrite POSITIVE (*)    Leukocytes, UA LARGE (*)    All other components within normal limits  URINE MICROSCOPIC-ADD ON -  Abnormal; Notable for the following:    Squamous Epithelial / LPF FEW (*)    Bacteria, UA MANY (*)    All other components within normal limits  URINE CULTURE  POC URINE PREG, ED    Imaging Review No results found.   EKG Interpretation None      MDM   Final diagnoses:  Urinary tract infection    28 yo female with UTI.  Negative pregnancy test in mini lab.  No signs of pyelonephritis.  Pt given precautions for return.    Olivia Mackielga M Yostin Malacara, MD 06/28/13 [phone]

## 2013-06-30 LAB — URINE CULTURE

## 2013-07-02 ENCOUNTER — Telehealth (HOSPITAL_BASED_OUTPATIENT_CLINIC_OR_DEPARTMENT_OTHER): Payer: Self-pay | Admitting: Emergency Medicine

## 2013-07-02 NOTE — Telephone Encounter (Signed)
Post ED Visit - Positive Culture Follow-up  Culture report reviewed by antimicrobial stewardship pharmacist: []  Amy Noble, Pharm.D., BCPS [x]  Amy Noble, Pharm.D., BCPS []  Amy Noble, [address]Ph[address] BCPS []  Amy Noble, [address]Ph[address] BCPS, AAHIVP []  Amy Noble, Pharm.D., BCPS, AAHIVP []  Amy Noble, Pharm.D.  Positive urine culture Treated with Cipro, organism sensitive to the same and no further patient follow-up is required at this time.  Amy Noble 07/02/2013, 9:26 AM

## 2014-04-14 ENCOUNTER — Emergency Department (HOSPITAL_COMMUNITY)
Admission: EM | Admit: 2014-04-14 | Discharge: 2014-04-15 | Disposition: A | Discharge status: Home / Self Care | Payer: Medicaid Other | Attending: Emergency Medicine | Admitting: Emergency Medicine

## 2014-04-14 ENCOUNTER — Encounter (HOSPITAL_COMMUNITY): Payer: Self-pay

## 2014-04-14 DIAGNOSIS — Z3202 Encounter for pregnancy test, result negative: Secondary | ICD-10-CM | POA: Insufficient documentation

## 2014-04-14 DIAGNOSIS — A084 Viral intestinal infection, unspecified: Secondary | ICD-10-CM

## 2014-04-14 DIAGNOSIS — G8929 Other chronic pain: Secondary | ICD-10-CM | POA: Insufficient documentation

## 2014-04-14 DIAGNOSIS — Z79899 Other long term (current) drug therapy: Secondary | ICD-10-CM | POA: Insufficient documentation

## 2014-04-14 DIAGNOSIS — Z792 Long term (current) use of antibiotics: Secondary | ICD-10-CM | POA: Insufficient documentation

## 2014-04-14 DIAGNOSIS — Z880 Allergy status to penicillin: Secondary | ICD-10-CM | POA: Insufficient documentation

## 2014-04-14 LAB — CBC WITH DIFFERENTIAL/PLATELET
BASOS PCT: 0 % (ref 0–1)
Basophils Absolute: 0 10*3/uL (ref 0.0–0.1)
EOS ABS: 0.2 10*3/uL (ref 0.0–0.7)
EOS PCT: 2 % (ref 0–5)
HCT: 39.5 % (ref 36.0–46.0)
HEMOGLOBIN: 13.5 g/dL (ref 12.0–15.0)
Lymphocytes Relative: 30 % (ref 12–46)
Lymphs Abs: 2.6 10*3/uL (ref 0.7–4.0)
MCH: 32.8 pg (ref 26.0–34.0)
MCHC: 34.2 g/dL (ref 30.0–36.0)
MCV: 95.9 fL (ref 78.0–100.0)
Monocytes Absolute: 1 10*3/uL (ref 0.1–1.0)
Monocytes Relative: 12 % (ref 3–12)
NEUTROS ABS: 4.9 10*3/uL (ref 1.7–7.7)
Neutrophils Relative %: 56 % (ref 43–77)
PLATELETS: 202 10*3/uL (ref 150–400)
RBC: 4.12 MIL/uL (ref 3.87–5.11)
RDW: 11.9 % (ref 11.5–15.5)
WBC: 8.7 10*3/uL (ref 4.0–10.5)

## 2014-04-14 LAB — COMPREHENSIVE METABOLIC PANEL
ALBUMIN: 4.1 g/dL (ref 3.5–5.2)
ALT: 16 U/L (ref 0–35)
AST: 21 U/L (ref 0–37)
Alkaline Phosphatase: 82 U/L (ref 39–117)
Anion gap: 9 (ref 5–15)
BUN: 10 mg/dL (ref 6–23)
CALCIUM: 9.1 mg/dL (ref 8.4–10.5)
CO2: 24 mmol/L (ref 19–32)
CREATININE: 0.56 mg/dL (ref 0.50–1.10)
Chloride: 105 mmol/L (ref 96–112)
GFR calc Af Amer: >90 mL/min (ref >90)
GFR calc non Af Amer: >90 mL/min (ref >90)
Glucose, Bld: 97 mg/dL (ref 70–99)
POTASSIUM: 3.8 mmol/L (ref 3.5–5.1)
Sodium: 138 mmol/L (ref 135–145)
Total Bilirubin: 0.3 mg/dL (ref 0.3–1.2)
Total Protein: 7.4 g/dL (ref 6.0–8.3)

## 2014-04-14 LAB — URINALYSIS, ROUTINE W REFLEX MICROSCOPIC
BILIRUBIN URINE: NEGATIVE
GLUCOSE, UA: NEGATIVE mg/dL
Hgb urine dipstick: NEGATIVE
Ketones, ur: NEGATIVE mg/dL
Leukocytes, UA: NEGATIVE
NITRITE: NEGATIVE
Protein, ur: NEGATIVE mg/dL
Specific Gravity, Urine: 1.027 (ref 1.005–1.030)
UROBILINOGEN UA: 0.2 mg/dL (ref 0.0–1.0)
pH: 6 (ref 5.0–8.0)

## 2014-04-14 LAB — POC OCCULT BLOOD, ED: Fecal Occult Bld: NEGATIVE

## 2014-04-14 LAB — POC URINE PREG, ED: Preg Test, Ur: NEGATIVE

## 2014-04-14 LAB — LIPASE, BLOOD: Lipase: 25 U/L (ref 11–59)

## 2014-04-14 MED ORDER — ONDANSETRON HCL 4 MG/2ML IJ SOLN
4.0000 mg | Freq: Once | INTRAMUSCULAR | Status: AC
  Administered 2014-04-14: 4 mg via INTRAVENOUS
  Filled 2014-04-14: qty 2

## 2014-04-14 MED ORDER — SODIUM CHLORIDE 0.9 % IV BOLUS (SEPSIS)
1000.0000 mL | Freq: Once | INTRAVENOUS | Status: AC
  Administered 2014-04-14: 1000 mL via INTRAVENOUS

## 2014-04-14 MED ORDER — DICYCLOMINE HCL 20 MG PO TABS
10.0000 mg | ORAL_TABLET | Freq: Once | ORAL | Status: AC
  Administered 2014-04-15: 10 mg via ORAL
  Filled 2014-04-14: qty 1

## 2014-04-14 NOTE — ED Provider Notes (Signed)
CSN: [id number]     Arrival date & time 04/14/14  1913 History   First MD Initiated Contact with Patient 04/14/14 2142     Chief Complaint  Patient presents with  . Diarrhea     (Consider location/radiation/quality/duration/timing/severity/associated sxs/prior Treatment) HPI Amy Noble is a 29 year old female with no significant past medical history who presents the ER complaining of nausea, vomiting, diarrhea 6 days. Patient states her symptoms began gradually on Monday of this week, and has since persisted. She reports multiple episodes of nonbilious, nonbloody vomiting, and reports that last 3 days has been having black colored stools. Patient reports she has been using Pepto-Bismol for the first several days of her symptoms to help control them. Patient reports an associated cramping upper abdominal discomfort which is intermittent and is mostly relieved after she has a bowel movement. Patient denies associated chest pain, shortness of breath, dizziness, weakness, headache, fever, dysuria.  Past Medical History  Diagnosis Date  . Chronic back pain    History reviewed. No pertinent past surgical history. No family history on file. History  Substance Use Topics  . Smoking status: Never Smoker   . Smokeless tobacco: Not on file  . Alcohol Use: Yes     Comment: occ   OB History    No data available     Review of Systems  Constitutional: Negative for fever.  HENT: Negative for trouble swallowing.   Eyes: Negative for visual disturbance.  Respiratory: Negative for shortness of breath.   Cardiovascular: Negative for chest pain.  Gastrointestinal: Positive for nausea, vomiting and diarrhea. Negative for abdominal pain.       Abdominal cramping  Genitourinary: Negative for dysuria.  Musculoskeletal: Negative for neck pain.  Skin: Negative for rash.  Neurological: Negative for dizziness, weakness and numbness.  Psychiatric/Behavioral: Negative.       Allergies   Penicillins  Home Medications   Prior to Admission medications   Medication Sig Start Date End Date Taking? Authorizing Provider  bismuth subsalicylate (PEPTO BISMOL) 262 MG chewable tablet Chew 524 mg by mouth 3 (three) times daily as needed for indigestion (indigestion).   Yes Historical Provider, MD  bismuth subsalicylate (PEPTO BISMOL) 262 MG/15ML suspension Take 30 mLs by mouth every 4 (four) hours as needed for indigestion (indigestion).   Yes Historical Provider, MD  Calcium Carbonate Antacid (TUMS PO) Take 3 tablets by mouth 4 (four) times daily as needed (indigestion).   Yes Historical Provider, MD  ibuprofen (ADVIL,MOTRIN) 200 MG tablet Take 200 mg by mouth every 6 (six) hours as needed for moderate pain (pain).   Yes Historical Provider, MD  loperamide (IMODIUM A-D) 2 MG tablet Take 2 mg by mouth 3 (three) times daily as needed for diarrhea or loose stools (diarrhea).   Yes Historical Provider, MD  ciprofloxacin (CIPRO) 500 MG tablet Take 1 tablet (500 mg total) by mouth 2 (two) times daily. Patient not taking: Reported on 04/14/2014 06/28/13   Olivia Mackie, MD  levonorgestrel [facility]) 20 MCG/24HR IUD 1 each by Intrauterine route once.    Historical Provider, MD  naproxen (NAPROSYN) 500 MG tablet Take 1 tablet (500 mg total) by mouth 2 (two) times daily. Patient not taking: Reported on 04/14/2014 06/28/13   Olivia Mackie, MD  ondansetron (ZOFRAN) 4 MG tablet Take 1 tablet (4 mg total) by mouth every 6 (six) hours. 04/15/14   Monte Fantasia, PA-C  phenazopyridine (PYRIDIUM) 200 MG tablet Take 1 tablet (200 mg total) by mouth 3 (three) times  daily. Patient not taking: Reported on 04/14/2014 06/28/13   Olivia Mackielga M Otter, MD   BP 114/68 mmHg  Pulse 77  Temp(Src) 98.1 F (36.7 C) (Oral)  Resp 18  Ht 5\' 2"  (1.575 m)  Wt 175 lb (79.379 kg)  BMI 32.00 kg/m2  SpO2 100% Physical Exam  Constitutional: She is oriented to person, place, and time. She appears well-developed and well-nourished. No  distress.  HENT:  Head: Normocephalic and atraumatic.  Mouth/Throat: Oropharynx is clear and moist. No oropharyngeal exudate.  Eyes: Right eye exhibits no discharge. Left eye exhibits no discharge. No scleral icterus.  Neck: Normal range of motion.  Cardiovascular: Normal rate, regular rhythm and normal heart sounds.   No murmur heard. Pulmonary/Chest: Effort normal and breath sounds normal. No respiratory distress.  Abdominal: Soft. There is tenderness in the epigastric area. There is no rigidity, no rebound, no guarding, no tenderness at McBurney's point and negative Murphy's sign.  Mild epigastric tenderness.  Genitourinary: Rectum normal. Rectal exam shows no external hemorrhoid, no internal hemorrhoid, no fissure, no mass, no tenderness and anal tone normal. Guaiac negative stool. Pelvic exam was performed with patient in the knee-chest position.  Chaperone present during entire rectal exam.   Musculoskeletal: Normal range of motion. She exhibits no edema or tenderness.  Neurological: She is alert and oriented to person, place, and time. No cranial nerve deficit. Coordination normal.  Skin: Skin is warm and dry. No rash noted. She is not diaphoretic.  Psychiatric: She has a normal mood and affect.  Nursing note and vitals reviewed.   ED Course  Procedures (including critical care time) Labs Review Labs Reviewed  URINALYSIS, ROUTINE W REFLEX MICROSCOPIC  CBC WITH DIFFERENTIAL/PLATELET  COMPREHENSIVE METABOLIC PANEL  LIPASE, BLOOD  OCCULT BLOOD X 1 CARD TO LAB, STOOL  POC URINE PREG, ED  POC OCCULT BLOOD, ED    Imaging Review No results found.   EKG Interpretation None      MDM   Final diagnoses:  Viral gastroenteritis    Patient with symptoms consistent with viral gastroenteritis.  Vitals are stable, no fever.  No signs of dehydration, tolerating PO fluids > 6 oz.  Lungs are clear.  No focal abdominal pain, no concern for appendicitis, cholecystitis, pancreatitis,  ruptured viscus, UTI, kidney stone, or any other abdominal etiology.  Black stools thought to be due to Pepto-Bismol use, patient's guaiac result is negative. We'll discharge patient and symptomatic therapy, return precautions discussed, patient encouraged to follow-up with primary care provider and return to the ER should she have any questions or concerns. Patient verbalizes understanding and agreement of this plan.  BP 114/68 mmHg  Pulse 77  Temp(Src) 98.1 F (36.7 C) (Oral)  Resp 18  Ht 5\' 2"  (1.575 m)  Wt 175 lb (79.379 kg)  BMI 32.00 kg/m2  SpO2 100%  Signed,  Ladona MowJoe Kambri Dismore, PA-C 12:39 AM  Patient discussed with Dr. Loren Raceravid Yelverton, MD   Monte FantasiaJoseph W Maronda Caison, PA-C 04/15/14 [id number]  Loren Raceravid Yelverton, MD 04/15/14 [phone]

## 2014-04-14 NOTE — ED Notes (Addendum)
Patient reports she has had vomiting and diarrhea x 6 days.  States she still feels nauseated, but has not had any emesis today.  Reports abdominal and back pain, that she thinks could be attributed to IUD that should have been removed in September of 2015.

## 2014-04-15 MED ORDER — ONDANSETRON HCL 4 MG PO TABS: 4.0000 mg | ORAL_TABLET | Freq: Four times a day (QID) | ORAL | Status: DC

## 2014-04-15 NOTE — Discharge Instructions (Signed)
Viral Gastroenteritis °Viral gastroenteritis is also known as stomach flu. This condition affects the stomach and intestinal tract. It can cause sudden diarrhea and vomiting. The illness typically lasts 3 to 8 days. Most people develop an immune response that eventually gets rid of the virus. While this natural response develops, the virus can make you quite ill. °CAUSES  °Many different viruses can cause gastroenteritis, such as rotavirus or noroviruses. You can catch one of these viruses by consuming contaminated food or water. You may also catch a virus by sharing utensils or other personal items with an infected person or by touching a contaminated surface. °SYMPTOMS  °The most common symptoms are diarrhea and vomiting. These problems can cause a severe loss of body fluids (dehydration) and a body salt (electrolyte) imbalance. Other symptoms may include: °· Fever. °· Headache. °· Fatigue. °· Abdominal pain. °DIAGNOSIS  °Your caregiver can usually diagnose viral gastroenteritis based on your symptoms and a physical exam. A stool sample may also be taken to test for the presence of viruses or other infections. °TREATMENT  °This illness typically goes away on its own. Treatments are aimed at rehydration. The most serious cases of viral gastroenteritis involve vomiting so severely that you are not able to keep fluids down. In these cases, fluids must be given through an intravenous line (IV). °HOME CARE INSTRUCTIONS  °· Drink enough fluids to keep your urine clear or pale yellow. Drink small amounts of fluids frequently and increase the amounts as tolerated. °· Ask your caregiver for specific rehydration instructions. °· Avoid: °¨ Foods high in sugar. °¨ Alcohol. °¨ Carbonated drinks. °¨ Tobacco. °¨ Juice. °¨ Caffeine drinks. °¨ Extremely hot or cold fluids. °¨ Fatty, greasy foods. °¨ Too much intake of anything at one time. °¨ Dairy products until 24 to 48 hours after diarrhea stops. °· You may consume probiotics.  Probiotics are active cultures of beneficial bacteria. They may lessen the amount and number of diarrheal stools in adults. Probiotics can be found in yogurt with active cultures and in supplements. °· Wash your hands well to avoid spreading the virus. °· Only take over-the-counter or prescription medicines for pain, discomfort, or fever as directed by your caregiver. Do not give aspirin to children. Antidiarrheal medicines are not recommended. °· Ask your caregiver if you should continue to take your regular prescribed and over-the-counter medicines. °· Keep all follow-up appointments as directed by your caregiver. °SEEK IMMEDIATE MEDICAL CARE IF:  °· You are unable to keep fluids down. °· You do not urinate at least once every 6 to 8 hours. °· You develop shortness of breath. °· You notice blood in your stool or vomit. This may look like coffee grounds. °· You have abdominal pain that increases or is concentrated in one small area (localized). °· You have persistent vomiting or diarrhea. °· You have a fever. °· The patient is a child younger than 3 months, and he or she has a fever. °· The patient is a child older than 3 months, and he or she has a fever and persistent symptoms. °· The patient is a child older than 3 months, and he or she has a fever and symptoms suddenly get worse. °· The patient is a baby, and he or she has no tears when crying. °MAKE SURE YOU:  °· Understand these instructions. °· Will watch your condition. °· Will get help right away if you are not doing well or get worse. °Document Released: 02/09/2005 Document Revised: 05/04/2011 Document Reviewed: 11/26/2010 °  ExitCare® Patient Information ©2015 [employer]. This information is not intended to replace advice given to you by your health care provider. Make sure you discuss any questions you have with your health care provider. ° ° °Food Choices to Help Relieve Diarrhea °When you have diarrhea, the foods you eat and your eating habits are  very important. Choosing the right foods and drinks can help relieve diarrhea. Also, because diarrhea can last up to 7 days, you need to replace lost fluids and electrolytes (such as sodium, potassium, and chloride) in order to help prevent dehydration.  °WHAT GENERAL GUIDELINES DO I NEED TO FOLLOW? °· Slowly drink 1 cup (8 oz) of fluid for each episode of diarrhea. If you are getting enough fluid, your urine will be clear or pale yellow. °· Eat starchy foods. Some good choices include white rice, white toast, pasta, low-fiber cereal, baked potatoes (without the skin), saltine crackers, and bagels. °· Avoid large servings of any cooked vegetables. °· Limit fruit to two servings per day. A serving is ½ cup or 1 small piece. °· Choose foods with less than 2 g of fiber per serving. °· Limit fats to less than 8 tsp (38 g) per day. °· Avoid fried foods. °· Eat foods that have probiotics in them. Probiotics can be found in certain dairy products. °· Avoid foods and beverages that may increase the speed at which food moves through the stomach and intestines (gastrointestinal tract). Things to avoid include: °¨ High-fiber foods, such as dried fruit, raw fruits and vegetables, nuts, seeds, and whole grain foods. °¨ Spicy foods and high-fat foods. °¨ Foods and beverages sweetened with high-fructose corn syrup, honey, or sugar alcohols such as xylitol, sorbitol, and mannitol. °WHAT FOODS ARE RECOMMENDED? °Grains °White rice. White, French, or pita breads (fresh or toasted), including plain rolls, buns, or bagels. White pasta. Saltine, soda, or graham crackers. Pretzels. Low-fiber cereal. Cooked cereals made with water (such as cornmeal, farina, or cream cereals). Plain muffins. Matzo. Melba toast. Zwieback.  °Vegetables °Potatoes (without the skin). Strained tomato and vegetable juices. Most well-cooked and canned vegetables without seeds. Tender lettuce. °Fruits °Cooked or canned applesauce, apricots, cherries, fruit  cocktail, grapefruit, peaches, pears, or plums. Fresh bananas, apples without skin, cherries, grapes, cantaloupe, grapefruit, peaches, oranges, or plums.  °Meat and Other Protein Products °Baked or boiled chicken. Eggs. Tofu. Fish. Seafood. Smooth peanut butter. Ground or well-cooked tender beef, ham, veal, lamb, pork, or poultry.  °Dairy °Plain yogurt, kefir, and unsweetened liquid yogurt. Lactose-free milk, buttermilk, or soy milk. Plain hard cheese. °Beverages °Sport drinks. Clear broths. Diluted fruit juices (except prune). Regular, caffeine-free sodas such as ginger ale. Water. Decaffeinated teas. Oral rehydration solutions. Sugar-free beverages not sweetened with sugar alcohols. °Other °Bouillon, broth, or soups made from recommended foods.  °The items listed above may not be a complete list of recommended foods or beverages. Contact your dietitian for more options. °WHAT FOODS ARE NOT RECOMMENDED? °Grains °Whole grain, whole wheat, bran, or rye breads, rolls, pastas, crackers, and cereals. Wild or brown rice. Cereals that contain more than 2 g of fiber per serving. Corn tortillas or taco shells. Cooked or dry oatmeal. Granola. Popcorn. °Vegetables °Raw vegetables. Cabbage, broccoli, Brussels sprouts, artichokes, baked beans, beet greens, corn, kale, legumes, peas, sweet potatoes, and yams. Potato skins. Cooked spinach and cabbage. °Fruits °Dried fruit, including raisins and dates. Raw fruits. Stewed or dried prunes. Fresh apples with skin, apricots, mangoes, pears, raspberries, and strawberries.  °Meat and Other Protein Products °Chunky   peanut butter. Nuts and seeds. Beans and lentils. Bacon.  °Dairy °High-fat cheeses. Milk, chocolate milk, and beverages made with milk, such as milk shakes. Cream. Ice cream. °Sweets and Desserts °Sweet rolls, doughnuts, and sweet breads. Pancakes and waffles. °Fats and Oils °Butter. Cream sauces. Margarine. Salad oils. Plain salad dressings. Olives. Avocados.   °Beverages °Caffeinated beverages (such as coffee, tea, soda, or energy drinks). Alcoholic beverages. Fruit juices with pulp. Prune juice. Soft drinks sweetened with high-fructose corn syrup or sugar alcohols. °Other °Coconut. Hot sauce. Chili powder. Mayonnaise. Gravy. Cream-based or milk-based soups.  °The items listed above may not be a complete list of foods and beverages to avoid. Contact your dietitian for more information. °WHAT SHOULD I DO IF I BECOME DEHYDRATED? °Diarrhea can sometimes lead to dehydration. Signs of dehydration include dark urine and dry mouth and skin. If you think you are dehydrated, you should rehydrate with an oral rehydration solution. These solutions can be purchased at pharmacies, retail stores, or online.  °Drink ½-1 cup (120-240 mL) of oral rehydration solution each time you have an episode of diarrhea. If drinking this amount makes your diarrhea worse, try drinking smaller amounts more often. For example, drink 1-3 tsp (5-15 mL) every 5-10 minutes.  °A general rule for staying hydrated is to drink 1½-2 L of fluid per day. Talk to your health care provider about the specific amount you should be drinking each day. Drink enough fluids to keep your urine clear or pale yellow. °Document Released: 05/02/2003 Document Revised: 02/14/2013 Document Reviewed: 01/02/2013 °ExitCare® Patient Information ©2015 [employer]. This information is not intended to replace advice given to you by your health care provider. Make sure you discuss any questions you have with your health care provider. ° ° °Emergency Department Resource Guide °1) Find a Doctor and Pay Out of Pocket °Although you won't have to find out who is covered by your insurance plan, it is a good idea to ask around and get recommendations. You will then need to call the office and see if the doctor you have chosen will accept you as a new patient and what types of options they offer for patients who are self-pay. Some doctors  offer discounts or will set up payment plans for their patients who do not have insurance, but you will need to ask so you aren't surprised when you get to your appointment. ° °2) Contact Your Local Health Department °Not all health departments have doctors that can see patients for sick visits, but many do, so it is worth a call to see if yours does. If you don't know where your local health department is, you can check in your phone book. The CDC also has a tool to help you locate your state's health department, and many state websites also have listings of all of their local health departments. ° °3) Find a Walk-in Clinic °If your illness is not likely to be very severe or complicated, you may want to try a walk in clinic. These are popping up all over the country in pharmacies, drugstores, and shopping centers. They're usually staffed by nurse practitioners or physician assistants that have been trained to treat common illnesses and complaints. They're usually fairly quick and inexpensive. However, if you have serious medical issues or chronic medical problems, these are probably not your best option. ° °No Primary Care Doctor: °- Call [facility] at  832-8000 - they can help you locate a primary care doctor that  accepts your insurance, provides   certain services, etc. °- Physician Referral Service- 1-800-533-3463 ° °Chronic Pain Problems: °Organization         Address  Phone   Notes  °[facility]  (336) [phone] Patients need to be referred by their primary care doctor.  ° °Medication Assistance: °Organization         Address  Phone   Notes  °[place] [facility] [address] °[place], [place] [address] (336) 641-8030 --Must be a resident of [place] °-- Must have NO insurance coverage whatsoever (no Medicaid/ Medicare, etc.) °-- The pt. MUST have a primary care doctor that directs their care regularly and follows them in the community °   °[facility]  (866) 331-1348   °[employer]  (888) 892-1162   ° °Agencies that provide inexpensive medical care: °Organization         Address  Phone   Notes  °[facility]  (336) 832-8035   °[facility]    (336) 832-7272   °[facility] [address] °[place], [place] [address] (336) 832-4777   °[facility] [address], °[place] (336) 271-4999   °[facility]    (336) 373-0678   °[facility]    (336) 272-1050   °[facility] ° [address], [place] Phone:  (336) 832-4444, Fax:  (336) 832-4440 Hours of Operation:  9 am - 6 pm, M-F.  Also accepts Medicaid/Medicare and self-pay.  °[facility] ° [address], [place] Phone: (336) [phone], Fax: (336) [phone]. Hours of Operation:  8:30 am - 5:30 pm, M-F.  Also accepts Medicaid and self-pay.  °[facility] [place] [address], [place] Phone: (336) 878-6027   °[facility] [address], [place], [place] (336)723-1848, Ext. 123 Mondays & Thursdays: 7-9 AM.  First 15 patients are seen on a first come, first serve basis. °  ° °Medicaid-accepting [place] Providers: ° °Organization         Address  Phone   Notes  °[facility] [address], [place] (336) 641-2100 Also accepts self-pay patients.  °[facility] [address], [place] ° (336) 856-9996   °[facility] [address], [place] (336) 288-8857   °[facility] [address] [place] [address], [place] (336) 299-7000   °Veita Bland [address], [place]  ° (336) 373-1557 Only accepts [place] Access Medicaid patients after they have their name applied to their card.  ° °Self-Pay (no insurance) in [place]: ° °Organization         Address  Phone   Notes  °Sickle Cell Patients, [facility] [address], [place] (336) 832-1970   °[facility] [address], [place] (336) 832-4400   °[facility] ° [address], [place] (336) 992-4800   °[facility]/Dr. Osei-Bonsu ° [address] [place] [address], [place] or [address], [place] (336) [phone] Phone number for both [place] and [place] locations is the same.  °[facility] [address], [place] (336) 299-0000   °[facility] [address] [place] [address], [place] or [address] (336) 852-7530 °(336) 878-2260   °[facility] [address], [place] (336) 350-1642, phone; (336) 294-5005, fax Sees patients 1st and 3rd Saturday of every month.  Must not qualify for public or private insurance (i.e. Medicaid, Medicare, [place] Health Choice, Veterans' Benefits) • Household income should be no more than 200% of the poverty level •The clinic cannot treat you if you are pregnant or think you are pregnant • Sexually transmitted diseases are not treated at the clinic.  ° ° °Dental Care: °Organization         Address  Phone  Notes  °[facility] [address], [place] (336) 641-6152 Accepts children up to age 21 who are enrolled in Medicaid or [place] Health Choice; pregnant women with a Medicaid card; and children who have applied for Medicaid or [place] Health Choice, but were declined, whose parents can pay a reduced fee at time of service.  °[facility] [place]  [address], [place] (336) 641-7733 Accepts children up to age 21 who are enrolled in Medicaid or [place] Health Choice; pregnant women with a Medicaid card; and children who have applied for Medicaid or [place] Health Choice, but were declined, whose parents can pay a reduced fee at time of service.  °[facility] ° [address], [place] (336) 641-4533 Patients are seen by appointment only. Walk-ins are not accepted. [facility] will see patients 18 years of age and older. °Monday - Tuesday (8am-5pm) °Most Wednesdays (8:30-5pm) °$30 per visit, cash only  °[facility] ° [address], [place] (336) [phone] Patients are seen by appointment only. Walk-ins are not accepted. [facility] will see patients 18 years of age and older. °One Wednesday Evening (Monthly: Volunteer Based).  $30 per visit, cash only  °[facility]  (919) 537-3737 for adults; Children under age 4, call Graduate Pediatric Dentistry at ([phone]) [phone]. Children aged 4-14, please call ([phone]) [phone] to request a pediatric application. ° Dental services are provided in all areas of dental care including fillings, crowns and bridges, complete and partial dentures, implants, gum treatment, root canals, and extractions. Preventive care is also provided. Treatment is provided to both adults and children. °Patients are selected via a lottery and there is often a waiting list. °  °[facility] [address], ° ° (336) 763-8833 [email or web address] °  °[facility] [address], [place], [place] (336)723-1848, Ext. 123 Second and Fourth Thursday of each month, opens at 6:30 AM; Clinic ends at 9 AM.  Patients are seen on a first-come first-served basis, and a limited number are seen during each clinic.  ° °[facility] ° [address], [place],  (336) [phone]   Eligibility Requirements °You must have lived in [place], [place], or [place] counties for at least the last three months. °  You cannot be eligible for state or federal sponsored healthcare insurance, including Veterans Administration, Medicaid, or Medicare. °  You generally cannot be eligible for healthcare insurance through your employer.  °  How to apply: °Eligibility screenings are held every Tuesday and  Wednesday afternoon from 1:00 pm until 4:00 pm. You do not need an appointment for the interview!  °[facility] [address], [place],  336-631-2330   °[facility]  336-342-8273   °[facility]  336-703-3100   °[facility]  336-570-6415   ° °  Behavioral Health Resources in the Community: °Intensive Outpatient Programs °Organization         Address  Phone  Notes  °[facility] [address], [place], [place] 336-878-6098   °[facility] [address], [place], [place] 336-832-9800   °[facility] [address], [place], [place] ° 336-882-2125   °[facility] [address],  °[place], [place] 1-800-853-5163 or 336-641-4981   °Substance Abuse Resources °Organization         Address  Phone  Notes  °[facility]  336-882-2125   °[facility]  336-784-9470   °[facility]  336-285-9073   °[facility]  336-845-3988   °Residential & Outpatient Substance Abuse Program  1-800-659-3381   °Psychological Services °Organization         Address  Phone  Notes  °[facility]  336- 832-9600   °[facility]  336- 378-7881   °[facility] [address], [place] 1-800-853-5163 or 336-641-4981   ° °Mobile Crisis Teams °Organization         Address  Phone  Notes  °[facility], Mobile Crisis Care Unit  1-877-626-1772   °Assertive °[facility] ° [address] [place], [place] 336-834-9664   °Sharon DeEsch [address] °[place] 336-554-5454   ° °Self-Help/Support Groups °Organization         Address  Phone             Notes  °[facility] [place] - variety of support groups  336- 373-1402 Call for more information  °Narcotics Anonymous (NA), [facility] [address], °[place]  2 meetings at this location  ° °Residential  Treatment Programs °Organization         Address  Phone  Notes  °[facility] [address],    °[place]  1-866-801-8205   °[facility] ° [address], [place], [place] 704-293-8524   °[facility] [address], [place] 336-845-3988 Admissions: 8am-3pm M-F  °[facility] [address]    °[place], [place] 336-841-1104   °[facility] [address], [place], [place] 336-379-7146   °[facility] [address]  °[place], [place] 336-285-9073   °[facility] Intensive Outpatient [address], [place], [place] 336-852-3033   °[facility] [address]  °[place], [place] 1-877-615-2722 or 336-784-9470   °[facility] [address] [place], [place] 336-227-7417 Accepts Medicaid  °[facility] [address]  °[place] 1-800-659-3381 Substance Abuse/Addiction Treatment  ° °[place] Behavioral Health Resources °Organization         Address  Phone  Notes  °[facility]  (888) 581-9988   °Julie Brannon, PhD [address] [place], [place]   (336) 349-5553 or (336) 951-0000   °[facility]   [address] °[place], [place] (336) 349-4454   °[facility] [address], [place], [place] (336) 342-8316 Insurance/Medicaid/sponsorship through [facility]  °[facility] [address]                                    [place],  (336) 342-8316 Therapy/tele-psych/case  °[facility] [address]  ° [place],   [place] (336) 349-2233    °Dr. Arfeen  (336) 349-4544   °[facility] [place]  [employer] [place] [facility] 1) [address], [place] °2) [address], [place] °3)  [address], [place] (336) 349-3220 °(336) 342-7768 ° °(336) 342-8140   °[place] [facility] (336) 342-1394 or (336) 342-3537 (After Hours)     ° ° ° °

## 2015-05-22 ENCOUNTER — Encounter (HOSPITAL_COMMUNITY): Payer: Self-pay | Admitting: *Deleted

## 2015-05-22 ENCOUNTER — Emergency Department (HOSPITAL_COMMUNITY)
Admission: EM | Admit: 2015-05-22 | Discharge: 2015-05-22 | Disposition: A | Discharge status: Home / Self Care | Payer: Medicaid Other | Attending: Emergency Medicine | Admitting: Emergency Medicine

## 2015-05-22 ENCOUNTER — Emergency Department (HOSPITAL_COMMUNITY): Payer: Medicaid Other

## 2015-05-22 DIAGNOSIS — G8929 Other chronic pain: Secondary | ICD-10-CM | POA: Diagnosis not present

## 2015-05-22 DIAGNOSIS — Z79899 Other long term (current) drug therapy: Secondary | ICD-10-CM | POA: Insufficient documentation

## 2015-05-22 DIAGNOSIS — J069 Acute upper respiratory infection, unspecified: Secondary | ICD-10-CM | POA: Diagnosis not present

## 2015-05-22 DIAGNOSIS — R05 Cough: Secondary | ICD-10-CM | POA: Diagnosis present

## 2015-05-22 DIAGNOSIS — Z880 Allergy status to penicillin: Secondary | ICD-10-CM | POA: Insufficient documentation

## 2015-05-22 MED ORDER — CETIRIZINE HCL 10 MG PO TABS
10.0000 mg | ORAL_TABLET | Freq: Every day | ORAL | Status: DC
Start: 2015-05-22 — End: 2021-02-18

## 2015-05-22 NOTE — Discharge Instructions (Signed)
Upper Respiratory Infection, Adult Most upper respiratory infections (URIs) are a viral infection of the air passages leading to the lungs. A URI affects the nose, throat, and upper air passages. The most common type of URI is nasopharyngitis and is typically referred to as "the common cold." URIs run their course and usually go away on their own. Most of the time, a URI does not require medical attention, but sometimes a bacterial infection in the upper airways can follow a viral infection. This is called a secondary infection. Sinus and middle ear infections are common types of secondary upper respiratory infections. Bacterial pneumonia can also complicate a URI. A URI can worsen asthma and chronic obstructive pulmonary disease (COPD). Sometimes, these complications can require emergency medical care and may be life threatening.  CAUSES Almost all URIs are caused by viruses. A virus is a type of germ and can spread from one person to another.  RISKS FACTORS You may be at risk for a URI if:   You smoke.   You have chronic heart or lung disease.  You have a weakened defense (immune) system.   You are very young or very old.   You have nasal allergies or asthma.  You work in crowded or poorly ventilated areas.  You work in health care facilities or schools. SIGNS AND SYMPTOMS  Symptoms typically develop 2-3 days after you come in contact with a cold virus. Most viral URIs last 7-10 days. However, viral URIs from the influenza virus (flu virus) can last 14-18 days and are typically more severe. Symptoms may include:   Runny or stuffy (congested) nose.   Sneezing.   Cough.   Sore throat.   Headache.   Fatigue.   Fever.   Loss of appetite.   Pain in your forehead, behind your eyes, and over your cheekbones (sinus pain).  Muscle aches.  DIAGNOSIS  Your health care provider may diagnose a URI by:  Physical exam.  Tests to check that your symptoms are not due to  another condition such as:  Strep throat.  Sinusitis.  Pneumonia.  Asthma. TREATMENT  A URI goes away on its own with time. It cannot be cured with medicines, but medicines may be prescribed or recommended to relieve symptoms. Medicines may help:  Reduce your fever.  Reduce your cough.  Relieve nasal congestion. HOME CARE INSTRUCTIONS   Take medicines only as directed by your health care provider.   Gargle warm saltwater or take cough drops to comfort your throat as directed by your health care provider.  Use a warm mist humidifier or inhale steam from a shower to increase air moisture. This may make it easier to breathe.  Drink enough fluid to keep your urine clear or pale yellow.   Eat soups and other clear broths and maintain good nutrition.   Rest as needed.   Return to work when your temperature has returned to normal or as your health care provider advises. You may need to stay home longer to avoid infecting others. You can also use a face mask and careful hand washing to prevent spread of the virus.  Increase the usage of your inhaler if you have asthma.   Do not use any tobacco products, including cigarettes, chewing tobacco, or electronic cigarettes. If you need help quitting, ask your health care provider. PREVENTION  The best way to protect yourself from getting a cold is to practice good hygiene.   Avoid oral or hand contact with people with cold   symptoms.   Wash your hands often if contact occurs.  There is no clear evidence that vitamin C, vitamin E, echinacea, or exercise reduces the chance of developing a cold. However, it is always recommended to get plenty of rest, exercise, and practice good nutrition.  SEEK MEDICAL CARE IF:   You are getting worse rather than better.   Your symptoms are not controlled by medicine.   You have chills.  You have worsening shortness of breath.  You have brown or red mucus.  You have yellow or brown nasal  discharge.  You have pain in your face, especially when you bend forward.  You have a fever.  You have swollen neck glands.  You have pain while swallowing.  You have white areas in the back of your throat. SEEK IMMEDIATE MEDICAL CARE IF:   You have severe or persistent:  Headache.  Ear pain.  Sinus pain.  Chest pain.  You have chronic lung disease and any of the following:  Wheezing.  Prolonged cough.  Coughing up blood.  A change in your usual mucus.  You have a stiff neck.  You have changes in your:  Vision.  Hearing.  Thinking.  Mood. MAKE SURE YOU:   Understand these instructions.  Will watch your condition.  Will get help right away if you are not doing well or get worse.   This information is not intended to replace advice given to you by your health care provider. Make sure you discuss any questions you have with your health care provider.   Document Released: 08/05/2000 Document Revised: 06/26/2014 Document Reviewed: 05/17/2013 Elsevier Interactive Patient Education 2016 [employer]  

## 2015-05-22 NOTE — ED Notes (Signed)
Pt complains of cough and sore throat for 1 week. Pt states she has felt hot and sweaty but has not taken temperature.

## 2015-05-22 NOTE — ED Provider Notes (Signed)
CSN: [id number]     Arrival date & time 05/22/15  1039 History   First MD Initiated Contact with Patient 05/22/15 1131     Chief Complaint  Patient presents with  . Cough     (Consider location/radiation/quality/duration/timing/severity/associated sxs/prior Treatment) Patient is a 30 y.o. female presenting with cough. The history is provided by the patient.  Cough Cough characteristics:  Productive Sputum characteristics:  Nondescript Severity:  Mild Onset quality:  Gradual Duration:  5 days Timing:  Constant Progression:  Unchanged Chronicity:  New Smoker: no   Relieved by:  Cough suppressants and decongestant Worsened by:  Nothing tried Ineffective treatments:  None tried Associated symptoms: diaphoresis, sinus congestion and sore throat   Associated symptoms: no chest pain, no chills, no ear fullness, no ear pain, no eye discharge, no fever, no headaches, no myalgias, no rhinorrhea, no shortness of breath, no weight loss and no wheezing     Past Medical History  Diagnosis Date  . Chronic back pain    History reviewed. No pertinent past surgical history. No family history on file. Social History  Substance Use Topics  . Smoking status: Never Smoker   . Smokeless tobacco: None  . Alcohol Use: Yes     Comment: occ   OB History    No data available     Review of Systems  Constitutional: Positive for diaphoresis. Negative for fever, chills and weight loss.  HENT: Positive for congestion and sore throat. Negative for ear pain, rhinorrhea and sinus pressure.   Eyes: Negative for discharge.  Respiratory: Positive for cough. Negative for chest tightness, shortness of breath and wheezing.   Cardiovascular: Negative for chest pain.  Gastrointestinal: Negative for nausea, vomiting, abdominal pain and diarrhea.  Musculoskeletal: Negative for myalgias, neck pain and neck stiffness.  Neurological: Negative for headaches.  All other systems reviewed and are  negative.     Allergies  Penicillins  Home Medications   Prior to Admission medications   Medication Sig Start Date End Date Taking? Authorizing Provider  bismuth subsalicylate (PEPTO BISMOL) 262 MG chewable tablet Chew 524 mg by mouth 3 (three) times daily as needed for indigestion (indigestion).    Historical Provider, MD  bismuth subsalicylate (PEPTO BISMOL) 262 MG/15ML suspension Take 30 mLs by mouth every 4 (four) hours as needed for indigestion (indigestion).    Historical Provider, MD  Calcium Carbonate Antacid (TUMS PO) Take 3 tablets by mouth 4 (four) times daily as needed (indigestion).    Historical Provider, MD  ciprofloxacin (CIPRO) 500 MG tablet Take 1 tablet (500 mg total) by mouth 2 (two) times daily. Patient not taking: Reported on 04/14/2014 06/28/13   Marisa Severin, MD  ibuprofen (ADVIL,MOTRIN) 200 MG tablet Take 200 mg by mouth every 6 (six) hours as needed for moderate pain (pain).    Historical Provider, MD  levonorgestrel (MIRENA) 20 MCG/24HR IUD 1 each by Intrauterine route once.    Historical Provider, MD  loperamide (IMODIUM A-D) 2 MG tablet Take 2 mg by mouth 3 (three) times daily as needed for diarrhea or loose stools (diarrhea).    Historical Provider, MD  naproxen (NAPROSYN) 500 MG tablet Take 1 tablet (500 mg total) by mouth 2 (two) times daily. Patient not taking: Reported on 04/14/2014 06/28/13   Marisa Severin, MD  ondansetron (ZOFRAN) 4 MG tablet Take 1 tablet (4 mg total) by mouth every 6 (six) hours. 04/15/14   Ladona Mow, PA-C  phenazopyridine (PYRIDIUM) 200 MG tablet Take 1 tablet (200 mg total)  by mouth 3 (three) times daily. Patient not taking: Reported on 04/14/2014 06/28/13   Marisa Severinlga Otter, MD   BP 122/78 mmHg  Pulse 82  Temp(Src) 98.4 F (36.9 C) (Oral)  Resp 18  SpO2 98%  LMP 05/15/2015 Physical Exam  Constitutional: She is oriented to person, place, and time. She appears well-developed and well-nourished.  Non-toxic appearance. She does not have a sickly  appearance. She does not appear ill.  HENT:  Head: Normocephalic and atraumatic.  Right Ear: Hearing and tympanic membrane normal.  Left Ear: Hearing and tympanic membrane normal.  Nose: Nose normal.  Mouth/Throat: Uvula is midline, oropharynx is clear and moist and mucous membranes are normal. No oropharyngeal exudate, posterior oropharyngeal edema or posterior oropharyngeal erythema.  Eyes: Conjunctivae are normal. Pupils are equal, round, and reactive to light.  Neck: Normal range of motion. Neck supple.  Cardiovascular: Normal rate, regular rhythm and normal heart sounds.   No murmur heard. Pulmonary/Chest: Effort normal and breath sounds normal. No accessory muscle usage or stridor. No respiratory distress. She has no wheezes. She has no rhonchi. She has no rales.  Abdominal: Soft. Bowel sounds are normal. She exhibits no distension. There is no tenderness.  Musculoskeletal: Normal range of motion.  Lymphadenopathy:    She has no cervical adenopathy.  Neurological: She is alert and oriented to person, place, and time.  Speech clear without dysarthria.  Skin: Skin is warm and dry.  Psychiatric: She has a normal mood and affect. Her behavior is normal.    ED Course  Procedures (including critical care time) Labs Review Labs Reviewed - No data to display  Imaging Review Dg Chest 2 View  05/22/2015  CLINICAL DATA:  Cough, congestion and sore throat for 1 week. Initial encounter. EXAM: CHEST  2 VIEW COMPARISON:  PA and lateral chest 10/15/2005. FINDINGS: The lungs are clear. Heart size is upper normal. No pneumothorax or pleural effusion. No focal bony abnormality. IMPRESSION: Negative exam. Electronically Signed   By: Drusilla Kannerhomas  Dalessio M.D.   On: 05/22/2015 11:59   I have personally reviewed and evaluated these images and lab results as part of my medical decision-making.   EKG Interpretation None      MDM   Final diagnoses:  URI (upper respiratory infection)    Likely  viral URI.  VSS, NAD.  No coughing observed in ED. HENT exam unremarkable, heart RRR, lungs CTAB, abdomen soft and benign.  Will obtain CXR to evaluate for PNA.  CENTOR criteria--0. No indication for further testing.  CXR negative.  Plan to discharge home with zyrtec.  Recommend continued use of OTC cold medicine.  Follow up PCP.  Discussed return precautions.  Patient agrees and acknowledges the above plan for discharge.    Cheri FowlerKayla Taite Baldassari, PA-C 05/22/15 1235  Linwood DibblesJon Knapp, MD 05/22/15 [phone]

## 2015-10-11 ENCOUNTER — Ambulatory Visit (INDEPENDENT_AMBULATORY_CARE_PROVIDER_SITE_OTHER): Payer: Medicaid Other | Admitting: Family

## 2015-10-11 ENCOUNTER — Encounter: Payer: Self-pay | Admitting: Family

## 2015-10-11 VITALS — BP 120/81 | HR 102 | Temp 97.7°F | Ht 62.0 in | Wt 221.8 lb

## 2015-10-11 DIAGNOSIS — O99320 Drug use complicating pregnancy, unspecified trimester: Secondary | ICD-10-CM | POA: Diagnosis not present

## 2015-10-11 DIAGNOSIS — F1120 Opioid dependence, uncomplicated: Secondary | ICD-10-CM | POA: Diagnosis not present

## 2015-10-11 DIAGNOSIS — Z3A14 14 weeks gestation of pregnancy: Secondary | ICD-10-CM

## 2015-10-11 DIAGNOSIS — O99321 Drug use complicating pregnancy, first trimester: Secondary | ICD-10-CM | POA: Diagnosis not present

## 2015-10-11 DIAGNOSIS — F1920 Other psychoactive substance dependence, uncomplicated: Secondary | ICD-10-CM

## 2015-10-11 NOTE — Progress Notes (Signed)
   Subjective:    Patient ID: Amy Noble, female    DOB: 12/20/1985, 30 y.o.   MRN: [id number]  HPI Pt presents to the office today to establish care. PT states she is [redacted] weeks pregnant and is currently taking suboxone. Pt states she does not have a prescription and is currently buying them off of the street. PT was told by her OBGYN to follow up with  Pain clinic. PT has appt with Dr. Gerilyn Pilgrim[place]quah on 10/14/15. PT needs a referral today. Pt denies any headache, palpitations, SOB, or edema at this time.     Review of Systems  Constitutional: Negative for diaphoresis.  Respiratory: Negative.   Gastrointestinal: Positive for nausea. Negative for constipation.  Genitourinary: Negative.   All other systems reviewed and are negative.  Social History   Social History  . Marital status: Single    Spouse name: N/A  . Number of children: N/A  . Years of education: N/A   Social History Main Topics  . Smoking status: Never Smoker  . Smokeless tobacco: Never Used  . Alcohol use Yes     Comment: occ  . Drug use:     Types: Oxycodone, Morphine, Opium, Marijuana  . Sexual activity: Yes    Birth control/ protection: IUD   Other Topics Concern  . None   Social History Narrative  . None    Family History  Problem Relation Age of Onset  . Cancer Mother        Objective:   Physical Exam  Constitutional: She is oriented to person, place, and time. She appears well-developed and well-nourished. No distress.  HENT:  Head: Normocephalic and atraumatic.  Eyes: Pupils are equal, round, and reactive to light.  Neck: Normal range of motion. Neck supple. No thyromegaly present.  Cardiovascular: Normal rate, regular rhythm, normal heart sounds and intact distal pulses.   No murmur heard. Pulmonary/Chest: Effort normal and breath sounds normal. No respiratory distress. She has no wheezes.  Abdominal: Soft. Bowel sounds are normal. She exhibits no distension. There is no tenderness.    Musculoskeletal: Normal range of motion. She exhibits no edema or tenderness.  Neurological: She is alert and oriented to person, place, and time.  Skin: Skin is warm and dry.  Psychiatric: She has a normal mood and affect. Her behavior is normal. Judgment and thought content normal.  Vitals reviewed.     BP 120/81   Pulse (!) 102   Temp 97.7 F (36.5 C) (Oral)   Ht 5\' 2"  (1.575 m)   Wt 221 lb 12.8 oz (100.6 kg)   BMI 40.57 kg/m      Assessment & Plan:  1. 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 Drug dependence affecting pregnancy (HCC) - Ambulatory referral to Pain Clinic  3. [redacted] weeks gestation of pregnancy - Ambulatory referral to Pain Clinic   Continue all meds and keep appt with [facility]!!! Health Maintenance reviewed Diet and exercise encouraged RTO as needed  Jannifer Rodneyhristy Kayonna Lawniczak, FNP

## 2015-10-11 NOTE — Patient Instructions (Signed)
Second Trimester of Pregnancy The second trimester is from week 13 through week 28, months 4 through 6. The second trimester is often a time when you feel your best. Your body has also adjusted to being pregnant, and you begin to feel better physically. Usually, morning sickness has lessened or quit completely, you may have more energy, and you may have an increase in appetite. The second trimester is also a time when the fetus is growing rapidly. At the end of the sixth month, the fetus is about 9 inches long and weighs about 1 pounds. You will likely begin to feel the baby move (quickening) between 18 and 20 weeks of the pregnancy. BODY CHANGES Your body goes through many changes during pregnancy. The changes vary from woman to woman.   Your weight will continue to increase. You will notice your lower abdomen bulging out.  You may begin to get stretch marks on your hips, abdomen, and breasts.  You may develop headaches that can be relieved by medicines approved by your health care provider.  You may urinate more often because the fetus is pressing on your bladder.  You may develop or continue to have heartburn as a result of your pregnancy.  You may develop constipation because certain hormones are causing the muscles that push waste through your intestines to slow down.  You may develop hemorrhoids or swollen, bulging veins (varicose veins).  You may have back pain because of the weight gain and pregnancy hormones relaxing your joints between the bones in your pelvis and as a result of a shift in weight and the muscles that support your balance.  Your breasts will continue to grow and be tender.  Your gums may bleed and may be sensitive to brushing and flossing.  Dark spots or blotches (chloasma, mask of pregnancy) may develop on your face. This will likely fade after the baby is born.  A dark line from your belly button to the pubic area (linea nigra) may appear. This will likely fade  after the baby is born.  You may have changes in your hair. These can include thickening of your hair, rapid growth, and changes in texture. Some women also have hair loss during or after pregnancy, or hair that feels dry or thin. Your hair will most likely return to normal after your baby is born. WHAT TO EXPECT AT YOUR PRENATAL VISITS During a routine prenatal visit:  You will be weighed to make sure you and the fetus are growing normally.  Your blood pressure will be taken.  Your abdomen will be measured to track your baby's growth.  The fetal heartbeat will be listened to.  Any test results from the previous visit will be discussed. Your health care provider may ask you:  How you are feeling.  If you are feeling the baby move.  If you have had any abnormal symptoms, such as leaking fluid, bleeding, severe headaches, or abdominal cramping.  If you are using any tobacco products, including cigarettes, chewing tobacco, and electronic cigarettes.  If you have any questions. Other tests that may be performed during your second trimester include:  Blood tests that check for:  Low iron levels (anemia).  Gestational diabetes (between 24 and 28 weeks).  Rh antibodies.  Urine tests to check for infections, diabetes, or protein in the urine.  An ultrasound to confirm the proper growth and development of the baby.  An amniocentesis to check for possible genetic problems.  Fetal screens for spina bifida   and Down syndrome.  HIV (human immunodeficiency virus) testing. Routine prenatal testing includes screening for HIV, unless you choose not to have this test. HOME CARE INSTRUCTIONS   Avoid all smoking, herbs, alcohol, and unprescribed drugs. These chemicals affect the formation and growth of the baby.  Do not use any tobacco products, including cigarettes, chewing tobacco, and electronic cigarettes. If you need help quitting, ask your health care provider. You may receive  counseling support and other resources to help you quit.  Follow your health care provider's instructions regarding medicine use. There are medicines that are either safe or unsafe to take during pregnancy.  Exercise only as directed by your health care provider. Experiencing uterine cramps is a good sign to stop exercising.  Continue to eat regular, healthy meals.  Wear a good support bra for breast tenderness.  Do not use hot tubs, steam rooms, or saunas.  Wear your seat belt at all times when driving.  Avoid raw meat, uncooked cheese, cat litter boxes, and soil used by cats. These carry germs that can cause birth defects in the baby.  Take your prenatal vitamins.  Take 1500-2000 mg of calcium daily starting at the 20th week of pregnancy until you deliver your baby.  Try taking a stool softener (if your health care provider approves) if you develop constipation. Eat more high-fiber foods, such as fresh vegetables or fruit and whole grains. Drink plenty of fluids to keep your urine clear or pale yellow.  Take warm sitz baths to soothe any pain or discomfort caused by hemorrhoids. Use hemorrhoid cream if your health care provider approves.  If you develop varicose veins, wear support hose. Elevate your feet for 15 minutes, 3-4 times a day. Limit salt in your diet.  Avoid heavy lifting, wear low heel shoes, and practice good posture.  Rest with your legs elevated if you have leg cramps or low back pain.  Visit your dentist if you have not gone yet during your pregnancy. Use a soft toothbrush to brush your teeth and be gentle when you floss.  A sexual relationship may be continued unless your health care provider directs you otherwise.  Continue to go to all your prenatal visits as directed by your health care provider. SEEK MEDICAL CARE IF:   You have dizziness.  You have mild pelvic cramps, pelvic pressure, or nagging pain in the abdominal area.  You have persistent nausea,  vomiting, or diarrhea.  You have a bad smelling vaginal discharge.  You have pain with urination. SEEK IMMEDIATE MEDICAL CARE IF:   You have a fever.  You are leaking fluid from your vagina.  You have spotting or bleeding from your vagina.  You have severe abdominal cramping or pain.  You have rapid weight gain or loss.  You have shortness of breath with chest pain.  You notice sudden or extreme swelling of your face, hands, ankles, feet, or legs.  You have not felt your baby move in over an hour.  You have severe headaches that do not go away with medicine.  You have vision changes.   This information is not intended to replace advice given to you by your health care provider. Make sure you discuss any questions you have with your health care provider.   Document Released: 02/03/2001 Document Revised: 03/02/2014 Document Reviewed: 04/12/2012 Elsevier Interactive Patient Education 2016 [employer] Buprenorphine; Naloxone oral dissolving film What is this medicine? BUPRENORPHINE; NALOXONE (byoo pre NOR feen; nal OX one) is used to treat  certain types of drug dependence. This medicine may be used for other purposes; ask your health care provider or pharmacist if you have questions. What should I tell my health care provider before I take this medicine? They need to know if you have any of these conditions: -brain tumor -drink more than 3 alcohol-containing drinks per day -head injury -heart disease -kidney disease -liver disease -lung or breathing disease, like asthma -an unusual or allergic reaction to buprenorphine, naloxone, other medicines, foods, dyes, or preservatives -pregnant or trying to get pregnant -breast-feeding How should I use this medicine? For sublingual use (Suboxone): Drink water to moisten the mouth. Then, place the medicine under the tongue and let it dissolve. Follow the directions on the prescription label. Leave this medicine in the sealed foil  pack until you are ready to use it. If your dose requires you to take more than 1 film, place the second film under the tongue on other side of the mouth. If your dose requires you to take more than 2 films, place the third film under your tongue on either side after the first 2 films have dissolved. Do not let the films touch in your mouth. After you put this medicine in your mouth, do not move it. Do not swallow, cut, or chew the film. Take your medicine at regular intervals. Do not take your medicine more often than directed. For buccal use (Suboxone or Bunavail): Drink water to moisten the inside of the cheek. Then, place the medicine against the inside of the moistened cheek and let it dissolve. Follow the directions on the prescription label. Leave this medicine in the sealed foil pack until you are ready to use it. If your dose requires you to take more than 1 film, place the second film on the inside of the other cheek. If your dose requires you to take more than 2 films, place the third film on the inside of your right or left cheek after the first 2 films have dissolved. After you put this medicine in your mouth, do not move it. Do not swallow, cut, or chew the film. Take your medicine at regular intervals. Do not take your medicine more often than directed. A special MedGuide will be given to you by the pharmacist with each prescription and refill. Be sure to read this information carefully each time. Talk to your pediatrician regarding the use of this medicine in children. Special care may be needed. Overdosage: If you think you have taken too much of this medicine contact a poison control center or emergency room at once. NOTE: This medicine is only for you. Do not share this medicine with others. What if I miss a dose? If you miss a dose, take it as soon as you can. If it is almost time for your next dose, take only that dose. Do not take double or extra doses. What may interact with this  medicine? Do not take this medicine with any of the following medications: -butorphanol -nalbuphine -pentazocine This medicine may also interact with the following medications: -alcohol -antibiotics like erythromycin and clarithromycin -antihistamines for allergy, cough and cold -barbiturates like phenobarbital -carbamazepine -general anesthetics -medicines for depression, anxiety, or psychotic disturbances -medicines for fungal infections like fluconazole, itraconazole, ketoconazole, and voriconazole -medicines for sleep -medicines used to treat HIV infection or AIDS like ritonavir, saquinavir, and indinavir -muscle relaxants -narcotic medicines (opiates) for pain -phenothiazines like chlorpromazine, mesoridazine, prochlorperazine, thioridazine -phenytoin -rifampin or rifampicin -tramadol This list may not describe all  possible interactions. Give your health care provider a list of all the medicines, herbs, non-prescription drugs, or dietary supplements you use. Also tell them if you smoke, drink alcohol, or use illegal drugs. Some items may interact with your medicine. What should I watch for while using this medicine? Do not stop taking except on your doctor's advice. You may develop a severe reaction. Your doctor will tell you how much medicine to take. Wear a medical ID bracelet or chain, and carry a card that describes your disease and details of your medicine and dosage times. You may get drowsy or dizzy. Do not drive, use machinery, or do anything that needs mental alertness until you know how this medicine affects you. Do not stand or sit up quickly, especially if you are an older patient. This reduces the risk of dizzy or fainting spells. Alcohol may interfere with the effect of this medicine. Avoid alcoholic drinks. This medicine will cause constipation. Try to have a bowel movement at least every 2 to 3 days. If you do not have a bowel movement for 3 days, call your doctor or  health care professional. What side effects may I notice from receiving this medicine? Side effects that you should report to your doctor or health care professional as soon as possible: -allergic reactions like skin rash, itching or hives, swelling of the face, lips, or tongue -anxiety, irritability, nervousness or restlessness -breathing problems -cold, clammy skin or sweating -confusion -diarrhea -feeling faint or lightheaded, falls -severe stomach pain or vomiting -unusually weak or tired -yellowing of the skin or the whites of the eyes Side effects that usually do not require medical attention (report these to your doctor or health care professional if they continue or are bothersome): -constipation -headache -insomnia -nausea This list may not describe all possible side effects. Call your doctor for medical advice about side effects. You may report side effects to FDA at 1-800-FDA-1088. Where should I keep my medicine? Keep out of the reach of children. This medicine can be abused. Keep your medicine in a safe place to protect it from theft. Do not share this medicine with anyone. Selling or giving away this medicine is dangerous and against the law. Store at room temperature between 15 and 30 degrees C (56 and 86 degrees F). This medicine may cause accidental overdose and death if it is taken by other adults, children, or pets. Remove any unused films from the foil pack and flush them down the toilet to reduce the chance of harm. Do not use the medicine after the expiration date. NOTE: This sheet is a summary. It may not cover all possible information. If you have questions about this medicine, talk to your doctor, pharmacist, or health care provider.    2016, Elsevier/Gold Standard. (2013-11-30 21:00:49)

## 2016-04-20 ENCOUNTER — Telehealth: Payer: Self-pay

## 2016-04-20 NOTE — Telephone Encounter (Signed)
I don't see Skelaxin on pt's med list. I did not prescribe this for her.

## 2016-10-12 ENCOUNTER — Ambulatory Visit (INDEPENDENT_AMBULATORY_CARE_PROVIDER_SITE_OTHER): Payer: Medicaid Other | Admitting: Family

## 2016-10-12 ENCOUNTER — Encounter: Payer: Self-pay | Admitting: Family

## 2016-10-12 VITALS — BP 107/63 | HR 87 | Temp 98.5°F | Ht 62.0 in | Wt 227.4 lb

## 2016-10-12 DIAGNOSIS — A084 Viral intestinal infection, unspecified: Secondary | ICD-10-CM | POA: Diagnosis not present

## 2016-10-12 DIAGNOSIS — G894 Chronic pain syndrome: Secondary | ICD-10-CM | POA: Diagnosis not present

## 2016-10-12 DIAGNOSIS — Z5181 Encounter for therapeutic drug level monitoring: Secondary | ICD-10-CM

## 2016-10-12 DIAGNOSIS — Z79899 Other long term (current) drug therapy: Secondary | ICD-10-CM | POA: Diagnosis not present

## 2016-10-12 MED ORDER — ONDANSETRON HCL 4 MG PO TABS: 4.0000 mg | ORAL_TABLET | Freq: Three times a day (TID) | ORAL | 0 refills | Status: DC | PRN

## 2016-10-12 NOTE — Progress Notes (Signed)
   Subjective:    Patient ID: Amy Noble, female    DOB: 1985/10/30, 31 y.o.   MRN: [id number]  Pt presents to the office today for a referral to her pain clinic and nausea & vomiting. Pt is followed by Pain Clinic every month for chronic back pain. She is currently taking Suboxone.  Emesis   This is a new problem. The current episode started yesterday. The problem occurs 2 to 4 times per day. The problem has been gradually improving. The emesis has an appearance of bile. There has been no fever. Associated symptoms include chills and dizziness. Risk factors include ill contacts. She has tried bed rest and diet change for the symptoms. The treatment provided mild relief.      Review of Systems  Constitutional: Positive for chills.  Gastrointestinal: Positive for vomiting.  Neurological: Positive for dizziness.  All other systems reviewed and are negative.      Objective:   Physical Exam  Constitutional: She is oriented to person, place, and time. She appears well-developed and well-nourished. No distress.  HENT:  Head: Normocephalic and atraumatic.  Right Ear: External ear normal.  Left Ear: External ear normal.  Nose: Nose normal.  Mouth/Throat: Oropharynx is clear and moist.  Eyes: Pupils are equal, round, and reactive to light.  Neck: Normal range of motion. Neck supple. No thyromegaly present.  Cardiovascular: Normal rate, regular rhythm, normal heart sounds and intact distal pulses.   No murmur heard. Pulmonary/Chest: Effort normal and breath sounds normal. No respiratory distress. She has no wheezes.  Abdominal: Soft. Bowel sounds are normal. She exhibits no distension. There is no tenderness.  Musculoskeletal: Normal range of motion. She exhibits no edema or tenderness.  Neurological: She is alert and oriented to person, place, and time.  Skin: Skin is warm and dry.  Psychiatric: She has a normal mood and affect. Her behavior is normal. Judgment and thought content  normal.  Vitals reviewed.    BP 107/63   Pulse 87   Temp 98.5 F (36.9 C) (Oral)   Ht 5\' 2"  (1.575 m)   Wt 227 lb 6.4 oz (103.1 kg)   LMP 09/19/2016   BMI 41.59 kg/m      Assessment & Plan:  1. Chronic pain syndrome -Keep appts with Pain Clinic - Ambulatory referral to Pain Clinic  2. Encounter for monitoring Suboxone maintenance therapy - Ambulatory referral to Pain Clinic  3. Viral gastroenteritis Force fluds Bland diet Tylenol prn for fever or aches RTO prn  - ondansetron (ZOFRAN) 4 MG tablet; Take 1 tablet (4 mg total) by mouth every 8 (eight) hours as needed for nausea or vomiting.  Dispense: 20 tablet; Refill: 0   Jannifer Rodney, FNP

## 2016-10-12 NOTE — Patient Instructions (Signed)

## 2017-05-03 ENCOUNTER — Other Ambulatory Visit: Payer: Self-pay

## 2017-05-03 ENCOUNTER — Encounter (HOSPITAL_COMMUNITY): Payer: Self-pay | Admitting: Emergency Medicine

## 2017-05-03 ENCOUNTER — Emergency Department (HOSPITAL_COMMUNITY)
Admission: EM | Admit: 2017-05-03 | Discharge: 2017-05-04 | Disposition: A | Discharge status: Home / Self Care | Payer: Medicaid Other | Attending: Emergency Medicine | Admitting: Emergency Medicine

## 2017-05-03 DIAGNOSIS — M5441 Lumbago with sciatica, right side: Secondary | ICD-10-CM | POA: Diagnosis not present

## 2017-05-03 DIAGNOSIS — R1031 Right lower quadrant pain: Secondary | ICD-10-CM | POA: Diagnosis present

## 2017-05-03 LAB — URINALYSIS, ROUTINE W REFLEX MICROSCOPIC
Bilirubin Urine: NEGATIVE
GLUCOSE, UA: NEGATIVE mg/dL
HGB URINE DIPSTICK: NEGATIVE
Ketones, ur: NEGATIVE mg/dL
LEUKOCYTES UA: NEGATIVE
Nitrite: NEGATIVE
PH: 6 (ref 5.0–8.0)
Protein, ur: NEGATIVE mg/dL
Specific Gravity, Urine: 1.003 — ABNORMAL LOW (ref 1.005–1.030)

## 2017-05-03 MED ORDER — CYCLOBENZAPRINE HCL 10 MG PO TABS: 10.0000 mg | ORAL_TABLET | Freq: Two times a day (BID) | ORAL | 0 refills | Status: DC | PRN

## 2017-05-03 MED ORDER — NAPROXEN 375 MG PO TABS: 375.0000 mg | ORAL_TABLET | Freq: Two times a day (BID) | ORAL | 0 refills | Status: DC

## 2017-05-03 MED ORDER — KETOROLAC TROMETHAMINE 30 MG/ML IJ SOLN
30.0000 mg | Freq: Once | INTRAMUSCULAR | Status: AC
  Administered 2017-05-03: 30 mg via INTRAMUSCULAR
  Filled 2017-05-03: qty 1

## 2017-05-03 NOTE — ED Triage Notes (Signed)
Pt c/o lower back pain that radiates to the right flank and right leg.

## 2017-05-03 NOTE — ED Provider Notes (Signed)
[facility] Provider Note   CSN: [id number] Arrival date & time: 05/03/17  2100     History   Chief Complaint Chief Complaint  Patient presents with  . Back Pain    HPI Amy Noble is a 32 y.o. female.  Patient reporting right sided low back pain radiating into right hip and thigh. Feeling of numbness to central back. Right flank discomfort. No dysuria. No recent back injury.   The history is provided by the patient. No language interpreter was used.  Back Pain   This is a recurrent problem. The current episode started yesterday. The problem has been gradually worsening. The pain is associated with no known injury. The pain is present in the lumbar spine. The quality of the pain is described as shooting. The pain radiates to the right thigh. The pain is moderate. The symptoms are aggravated by certain positions and twisting. The pain is the same all the time. Pertinent negatives include no fever, no bowel incontinence, no perianal numbness, no bladder incontinence and no weakness.    Past Medical History:  Diagnosis Date  . Chronic back pain     Patient Active Problem List   Diagnosis Date Noted  . Pregnancy complicated by Suboxone maintenance, antepartum (HCC) 10/11/2015  . Drug dependence affecting pregnancy 10/11/2015    History reviewed. No pertinent surgical history.  OB History    No data available       Home Medications    Prior to Admission medications   Medication Sig Start Date End Date Taking? Authorizing Provider  Buprenorphine HCl-Naloxone HCl (SUBOXONE SL) Place under the tongue. Patient says she was getting from a friend not a provider.    [provider]  cetirizine (ZYRTEC) 10 MG tablet Take 1 tablet (10 mg total) by mouth daily. Patient not taking: Reported on 10/11/2015 05/22/15   Cheri Fowlerose, Kayla, PA-C  ondansetron (ZOFRAN) 4 MG tablet Take 1 tablet (4 mg total) by mouth every 8 (eight) hours as needed for nausea or  vomiting. 10/12/16   Junie SpencerHawks, Christy A, FNP    Family History Family History  Problem Relation Age of Onset  . Cancer Mother     Social History Social History   Tobacco Use  . Smoking status: Never Smoker  . Smokeless tobacco: Never Used  Substance Use Topics  . Alcohol use: Yes    Comment: occ  . Drug use: Yes    Types: Oxycodone, Morphine, Opium, Marijuana     Allergies   Penicillins   Review of Systems Review of Systems  Constitutional: Negative for fever.  Gastrointestinal: Negative for bowel incontinence.  Genitourinary: Negative for bladder incontinence.  Musculoskeletal: Positive for back pain.  Neurological: Negative for weakness.  All other systems reviewed and are negative.    Physical Exam Updated Vital Signs BP 125/83 (BP Location: Right Arm)   Pulse 84   Temp 98.4 F (36.9 C) (Oral)   Resp 16   Ht 5\' 3"  (1.6 m)   Wt 99.8 kg (220 lb)   SpO2 100%   BMI 38.97 kg/m   Physical Exam  Constitutional: She is oriented to person, place, and time. She appears well-developed and well-nourished.  HENT:  Head: Atraumatic.  Eyes: Conjunctivae are normal.  Neck: Neck supple.  Cardiovascular: Normal rate and regular rhythm.  Pulmonary/Chest: Effort normal and breath sounds normal.  Abdominal: Soft. Bowel sounds are normal. She exhibits no distension. There is no tenderness.  Musculoskeletal: Normal range of motion. She exhibits  tenderness.  Lymphadenopathy:    She has no cervical adenopathy.  Neurological: She is alert and oriented to person, place, and time. She has normal strength. No sensory deficit.  Skin: Skin is warm and dry. No rash noted.  Psychiatric: She has a normal mood and affect.  Nursing note and vitals reviewed.    ED Treatments / Results  Labs (all labs ordered are listed, but only abnormal results are displayed) Labs Reviewed  URINALYSIS, ROUTINE W REFLEX MICROSCOPIC - Abnormal; Notable for the following components:      Result  Value   Color, Urine COLORLESS (*)    Specific Gravity, Urine 1.003 (*)    All other components within normal limits    EKG  EKG Interpretation None       Radiology No results found.  Procedures Procedures (including critical care time)  Medications Ordered in ED Medications - No data to display   Initial Impression / Assessment and Plan / ED Course  I have reviewed the triage vital signs and the nursing notes.  Pertinent labs & imaging results that were available during my care of the patient were reviewed by me and considered in my medical decision making (see chart for details).     Patient with back pain.  No neurological deficits and normal neuro exam.  Patient is ambulatory.  No loss of bowel or bladder control.  No concern for cauda equina.  No fever, night sweats, weight loss, h/o cancer, IVDA, no recent procedure to back. No urinary symptoms suggestive of UTI.  Supportive care and return precaution discussed. Appears safe for discharge at this time. Follow up as indicated in discharge paperwork.   Final Clinical Impressions(s) / ED Diagnoses   Final diagnoses:  Right-sided low back pain with right-sided sciatica, unspecified chronicity    ED Discharge Orders        Ordered    naproxen (NAPROSYN) 375 MG tablet  2 times daily     05/03/17 2322    cyclobenzaprine (FLEXERIL) 10 MG tablet  2 times daily PRN     05/03/17 2322       Felicie Morn, NP 05/03/17 2323    Marily Memos, MD 05/04/17 0020

## 2017-05-24 ENCOUNTER — Encounter: Payer: Self-pay | Admitting: Family

## 2017-05-24 ENCOUNTER — Ambulatory Visit: Payer: Medicaid Other | Admitting: Family

## 2017-05-24 VITALS — BP 116/64 | HR 83 | Temp 97.1°F | Ht 63.0 in | Wt 225.0 lb

## 2017-05-24 DIAGNOSIS — M5441 Lumbago with sciatica, right side: Secondary | ICD-10-CM | POA: Diagnosis not present

## 2017-05-24 DIAGNOSIS — E669 Obesity, unspecified: Secondary | ICD-10-CM | POA: Insufficient documentation

## 2017-05-24 DIAGNOSIS — G8929 Other chronic pain: Secondary | ICD-10-CM

## 2017-05-24 MED ORDER — BACLOFEN 10 MG PO TABS: 10.0000 mg | ORAL_TABLET | Freq: Three times a day (TID) | ORAL | 0 refills | Status: DC

## 2017-05-24 MED ORDER — GABAPENTIN 100 MG PO CAPS: 100.0000 mg | ORAL_CAPSULE | Freq: Three times a day (TID) | ORAL | 3 refills | Status: DC

## 2017-05-24 NOTE — Patient Instructions (Signed)
In a few days you may receive a survey in the mail or online from [employer] regarding your visit with [place] today. Please take a moment to fill this out. Your feedback is very important to our whole office. It can help [place] better understand your needs as well as improve your experience and satisfaction. Thank you for taking your time to complete it. We care about you.  Amy Rodney, FNP   Sciatica Sciatica is pain, numbness, weakness, or tingling along the path of the sciatic nerve. The sciatic nerve starts in the lower back and runs down the back of each leg. The nerve controls the muscles in the lower leg and in the back of the knee. It also provides feeling (sensation) to the back of the thigh, the lower leg, and the sole of the foot. Sciatica is a symptom of another medical condition that pinches or puts pressure on the sciatic nerve. Generally, sciatica only affects one side of the body. Sciatica usually goes away on its own or with treatment. In some cases, sciatica may keep coming back (recur). What are the causes? This condition is caused by pressure on the sciatic nerve, or pinching of the sciatic nerve. This may be the result of:  A disk in between the bones of the spine (vertebrae) bulging out too far (herniated disk).  Age-related changes in the spinal disks (degenerative disk disease).  A pain disorder that affects a muscle in the buttock (piriformis syndrome).  Extra bone growth (bone spur) near the sciatic nerve.  An injury or break (fracture) of the pelvis.  Pregnancy.  Tumor (rare).  What increases the risk? The following factors may make you more likely to develop this condition:  Playing sports that place pressure or stress on the spine, such as football or weight lifting.  Having poor strength and flexibility.  A history of back injury.  A history of back surgery.  Sitting for long periods of time.  Doing activities that involve repetitive bending or  lifting.  Obesity.  What are the signs or symptoms? Symptoms can vary from mild to very severe, and they may include:  Any of these problems in the lower back, leg, hip, or buttock: ? Mild tingling or dull aches. ? Burning sensations. ? Sharp pains.  Numbness in the back of the calf or the sole of the foot.  Leg weakness.  Severe back pain that makes movement difficult.  These symptoms may get worse when you cough, sneeze, or laugh, or when you sit or stand for long periods of time. Being overweight may also make symptoms worse. In some cases, symptoms may recur over time. How is this diagnosed? This condition may be diagnosed based on:  Your symptoms.  A physical exam. Your health care provider may ask you to do certain movements to check whether those movements trigger your symptoms.  You may have tests, including: ? Blood tests. ? X-rays. ? MRI. ? CT scan.  How is this treated? In many cases, this condition improves on its own, without any treatment. However, treatment may include:  Reducing or modifying physical activity during periods of pain.  Exercising and stretching to strengthen your abdomen and improve the flexibility of your spine.  Icing and applying heat to the affected area.  Medicines that help: ? To relieve pain and swelling. ? To relax your muscles.  Injections of medicines that help to relieve pain, irritation, and inflammation around the sciatic nerve (steroids).  Surgery.  Follow these  instructions at home: Medicines  Take over-the-counter and prescription medicines only as told by your health care provider.  Do not drive or operate heavy machinery while taking prescription pain medicine. Managing pain  If directed, apply ice to the affected area. ? Put ice in a plastic bag. ? Place a towel between your skin and the bag. ? Leave the ice on for 20 minutes, 2-3 times a day.  After icing, apply heat to the affected area before you  exercise or as often as told by your health care provider. Use the heat source that your health care provider recommends, such as a moist heat pack or a heating pad. ? Place a towel between your skin and the heat source. ? Leave the heat on for 20-30 minutes. ? Remove the heat if your skin turns bright red. This is especially important if you are unable to feel pain, heat, or cold. You may have a greater risk of getting burned. Activity  Return to your normal activities as told by your health care provider. Ask your health care provider what activities are safe for you. ? Avoid activities that make your symptoms worse.  Take brief periods of rest throughout the day. Resting in a lying or standing position is usually better than sitting to rest. ? When you rest for longer periods, mix in some mild activity or stretching between periods of rest. This will help to prevent stiffness and pain. ? Avoid sitting for long periods of time without moving. Get up and move around at least one time each hour.  Exercise and stretch regularly, as told by your health care provider.  Do not lift anything that is heavier than 10 lb (4.5 kg) while you have symptoms of sciatica. When you do not have symptoms, you should still avoid heavy lifting, especially repetitive heavy lifting.  When you lift objects, always use proper lifting technique, which includes: ? Bending your knees. ? Keeping the load close to your body. ? Avoiding twisting. General instructions  Use good posture. ? Avoid leaning forward while sitting. ? Avoid hunching over while standing.  Maintain a healthy weight. Excess weight puts extra stress on your back and makes it difficult to maintain good posture.  Wear supportive, comfortable shoes. Avoid wearing high heels.  Avoid sleeping on a mattress that is too soft or too hard. A mattress that is firm enough to support your back when you sleep may help to reduce your pain.  Keep all  follow-up visits as told by your health care provider. This is important. Contact a health care provider if:  You have pain that wakes you up when you are sleeping.  You have pain that gets worse when you lie down.  Your pain is worse than you have experienced in the past.  Your pain lasts longer than 4 weeks.  You experience unexplained weight loss. Get help right away if:  You lose control of your bowel or bladder (incontinence).  You have: ? Weakness in your lower back, pelvis, buttocks, or legs that gets worse. ? Redness or swelling of your back. ? A burning sensation when you urinate. This information is not intended to replace advice given to you by your health care provider. Make sure you discuss any questions you have with your health care provider. Document Released: 02/03/2001 Document Revised: 07/16/2015 Document Reviewed: 10/19/2014 Elsevier Interactive Patient Education  [employer]2018 Elsevier Inc.

## 2017-05-24 NOTE — Progress Notes (Addendum)
   Subjective:    Patient ID: Saintclair HalstedHolly B Hosick, female    DOB: 01-05-1986, 32 y.o.   MRN: [id number]  Back Pain  This is a chronic problem. The current episode started more than 1 year ago. The problem occurs intermittently. The problem has been gradually worsening since onset. The pain is present in the lumbar spine. The quality of the pain is described as aching and shooting. The pain radiates to the right thigh. The pain is at a severity of 7/10. The pain is moderate. The symptoms are aggravated by bending and lying down. She has tried analgesics, bed rest, muscle relaxant, ice and NSAIDs for the symptoms. The treatment provided mild relief.      Review of Systems  Musculoskeletal: Positive for back pain.  All other systems reviewed and are negative.      Objective:   Physical Exam  Constitutional: She is oriented to person, place, and time. She appears well-developed and well-nourished. No distress.  HENT:  Head: Normocephalic and atraumatic.  Eyes: Pupils are equal, round, and reactive to light.  Neck: Normal range of motion. Neck supple. No thyromegaly present.  Cardiovascular: Normal rate, regular rhythm, normal heart sounds and intact distal pulses.  No murmur heard. Pulmonary/Chest: Effort normal and breath sounds normal. No respiratory distress. She has no wheezes.  Abdominal: Soft. Bowel sounds are normal. She exhibits no distension. There is no tenderness.  Musculoskeletal: Normal range of motion. She exhibits tenderness. She exhibits no edema.  Pain in bilateral lower back with flexion and extension, negative SLR  Neurological: She is alert and oriented to person, place, and time.  Skin: Skin is warm and dry.  Psychiatric: She has a normal mood and affect. Her behavior is normal. Judgment and thought content normal.  Vitals reviewed.    BP 116/64   Pulse 83   Temp (!) 97.1 F (36.2 C) (Oral)   Ht 5\' 3"  (1.6 m)   Wt 225 lb (102.1 kg)   BMI 39.86 kg/m        Assessment & Plan:  1. Chronic bilateral low back pain with right-sided sciatica Rest Ice  Will try baclofen instead of flexeril  Will also try gabapentin, start at night - Ambulatory referral to Physical Therapy - baclofen (LIORESAL) 10 MG tablet; Take 1 tablet (10 mg total) by mouth 3 (three) times daily.  Dispense: 30 each; Refill: 0 - gabapentin (NEURONTIN) 100 MG capsule; Take 1 capsule (100 mg total) by mouth 3 (three) times daily.  Dispense: 90 capsule; Refill: 3  2. Obesity (BMI 30-39.9)   Jannifer Rodneyhristy Kiree Dejarnette, FNP

## 2017-07-02 ENCOUNTER — Encounter (HOSPITAL_COMMUNITY): Payer: Self-pay

## 2017-07-02 ENCOUNTER — Emergency Department (HOSPITAL_COMMUNITY)
Admission: EM | Admit: 2017-07-02 | Discharge: 2017-07-02 | Disposition: A | Discharge status: Home / Self Care | Payer: Medicaid Other | Attending: Emergency Medicine | Admitting: Emergency Medicine

## 2017-07-02 ENCOUNTER — Emergency Department (HOSPITAL_COMMUNITY): Payer: Medicaid Other

## 2017-07-02 ENCOUNTER — Other Ambulatory Visit: Payer: Self-pay

## 2017-07-02 DIAGNOSIS — R197 Diarrhea, unspecified: Secondary | ICD-10-CM | POA: Insufficient documentation

## 2017-07-02 DIAGNOSIS — R112 Nausea with vomiting, unspecified: Secondary | ICD-10-CM

## 2017-07-02 DIAGNOSIS — Z79899 Other long term (current) drug therapy: Secondary | ICD-10-CM | POA: Diagnosis not present

## 2017-07-02 DIAGNOSIS — J069 Acute upper respiratory infection, unspecified: Secondary | ICD-10-CM

## 2017-07-02 DIAGNOSIS — R05 Cough: Secondary | ICD-10-CM | POA: Diagnosis present

## 2017-07-02 MED ORDER — PROMETHAZINE-PHENYLEPHRINE 6.25-5 MG/5ML PO SYRP: 5.0000 mL | ORAL_SOLUTION | ORAL | 0 refills | Status: DC | PRN

## 2017-07-02 MED ORDER — HYDROCOD POLST-CPM POLST ER 10-8 MG/5ML PO SUER
5.0000 mL | Freq: Once | ORAL | Status: AC
  Administered 2017-07-02: 5 mL via ORAL
  Filled 2017-07-02: qty 5

## 2017-07-02 MED ORDER — ONDANSETRON HCL 4 MG PO TABS: 4.0000 mg | ORAL_TABLET | Freq: Four times a day (QID) | ORAL | 0 refills | Status: DC

## 2017-07-02 NOTE — ED Provider Notes (Signed)
[facility] EMERGENCY DEPARTMENT Provider Note   CSN: [id number] Arrival date & time: 07/02/17  1900     History   Chief Complaint Chief Complaint  Patient presents with  . Cough  . Sore Throat    HPI Amy Noble is a 32 y.o. female who presents to the ED with c/o cough, sore throat and fever. Seen at an Urgent care 4 days ago and prescribed Z-pak and tessalon pearls. She also reports having a steroid injection while at urgent care. Patient reports taking the medication as directed but continues to have cough and sore throat. Patient reports that now she has developed n/v/d along with her child and significant other.   HPI  Past Medical History:  Diagnosis Date  . Chronic back pain     Patient Active Problem List   Diagnosis Date Noted  . Obesity (BMI 30-39.9) 05/24/2017  . Pregnancy complicated by Suboxone maintenance, antepartum (HCC) 10/11/2015  . Drug dependence affecting pregnancy 10/11/2015    History reviewed. No pertinent surgical history.   OB History   None      Home Medications    Prior to Admission medications   Medication Sig Start Date End Date Taking? Authorizing Provider  baclofen (LIORESAL) 10 MG tablet Take 1 tablet (10 mg total) by mouth 3 (three) times daily. 05/24/17   Junie Spencer, FNP  Buprenorphine HCl-Naloxone HCl (SUBOXONE SL) Place under the tongue. Patient says she was getting from a friend not a provider.    [provider]  cetirizine (ZYRTEC) 10 MG tablet Take 1 tablet (10 mg total) by mouth daily. Patient not taking: Reported on 10/11/2015 05/22/15   Cheri Fowler, PA-C  gabapentin (NEURONTIN) 100 MG capsule Take 1 capsule (100 mg total) by mouth 3 (three) times daily. 05/24/17   Jannifer Rodney A, FNP  naproxen (NAPROSYN) 375 MG tablet Take 1 tablet (375 mg total) by mouth 2 (two) times daily. 05/03/17   Felicie Morn, NP  ondansetron (ZOFRAN) 4 MG tablet Take 1 tablet (4 mg total) by mouth every 6 (six) hours. 07/02/17   Janne Napoleon, NP  promethazine-phenylephrine (PROMETHAZINE VC) 6.25-5 MG/5ML SYRP Take 5 mLs by mouth every 4 (four) hours as needed for congestion. 07/02/17   Janne Napoleon, NP    Family History Family History  Problem Relation Age of Onset  . Cancer Mother     Social History Social History   Tobacco Use  . Smoking status: Never Smoker  . Smokeless tobacco: Never Used  Substance Use Topics  . Alcohol use: Yes    Comment: occ  . Drug use: Yes    Types: Oxycodone, Morphine, Opium, Marijuana     Allergies   Penicillins   Review of Systems Review of Systems  Constitutional: Positive for chills.  HENT: Positive for congestion and sore throat.   Eyes: Negative for discharge, redness and itching.  Respiratory: Positive for cough and wheezing.   Gastrointestinal: Positive for diarrhea, nausea and vomiting.  Genitourinary: Negative for dysuria and urgency.  Musculoskeletal: Positive for myalgias.  Neurological: Negative for weakness and headaches.  Psychiatric/Behavioral: Negative for confusion.     Physical Exam Updated Vital Signs BP (!) 107/95 (BP Location: Right Arm)   Pulse 66   Temp 98.1 F (36.7 C)   Resp 16   Ht  (1.651 m)   Wt 102.1 kg (225 lb)   SpO2 96%   BMI 37.44 kg/m   Physical Exam  Constitutional: She appears well-developed  and well-nourished. No distress.  HENT:  Head: Normocephalic.  Right Ear: Tympanic membrane normal.  Left Ear: Tympanic membrane normal.  Nose: Rhinorrhea present.  Mouth/Throat: Uvula is midline, oropharynx is clear and moist and mucous membranes are normal.  Eyes: Pupils are equal, round, and reactive to light. Conjunctivae and EOM are normal.  Neck: Normal range of motion. Neck supple.  Cardiovascular: Normal rate and regular rhythm.  Pulmonary/Chest: Effort normal and breath sounds normal.  Abdominal: Soft. There is no tenderness.  Musculoskeletal: Normal range of motion.  Lymphadenopathy:    She has no cervical  adenopathy.  Neurological: She is alert.  Skin: Skin is warm and dry.  Psychiatric: She has a normal mood and affect. Her behavior is normal.  Nursing note and vitals reviewed.    ED Treatments / Results  Labs (all labs ordered are listed, but only abnormal results are displayed) Labs Reviewed - No data to display  Radiology Dg Chest 2 View  Result Date: 07/02/2017 CLINICAL DATA:  Cough and chest congestion for the past week. EXAM: CHEST - 2 VIEW COMPARISON:  11/11/2016. FINDINGS: Mildly enlarged cardiac silhouette with an interval decrease in size. Clear lungs with normal vascularity. Normal appearing bones IMPRESSION: No acute abnormality.  Mild cardiomegaly with improvement. Electronically Signed   By: Beckie Salts M.D.   On: 07/02/2017 21:55    Procedures Procedures (including critical care time)  Medications Ordered in ED Medications  chlorpheniramine-HYDROcodone (TUSSIONEX) 10-8 MG/5ML suspension 5 mL (5 mLs Oral Given 07/02/17 2120)     Initial Impression / Assessment and Plan / ED Course  I have reviewed the triage vital signs and the nursing notes. Pt CXR negative for acute infiltrate. Patients symptoms are consistent with URI, likely viral etiology and n/v/d most likely viral. Discussed that antibiotics are not indicated for viral infections. Pt will be discharged with symptomatic treatment.  Verbalizes understanding and is agreeable with plan. Pt is hemodynamically stable & in NAD prior to dc.  Final Clinical Impressions(s) / ED Diagnoses   Final diagnoses:  Upper respiratory tract infection, unspecified type  Nausea vomiting and diarrhea    ED Discharge Orders        Ordered    ondansetron (ZOFRAN) 4 MG tablet  Every 6 hours     07/02/17 2206    promethazine-phenylephrine (PROMETHAZINE VC) 6.25-5 MG/5ML SYRP  Every 4 hours PRN     07/02/17 2206       Kerrie Buffalo [place], [place] 07/02/17 2208    Margarita Grizzle, MD 07/04/17 0006

## 2017-07-02 NOTE — ED Notes (Signed)
Pt seen at urgent care Dx bronchitis  Given a z pack  Told to follow with her dr [facility])- pt has not and has not made an appt  Her after vomiting x 1 today and reporting D   Education antibiotics and N/V/D possible and need for probiotics after taking antibiotics  Pt then states boyfriend and child with same sx and she wanted to make sure "it wasn't a virus"

## 2017-07-02 NOTE — ED Triage Notes (Addendum)
Pt c/o cough, sore throat, that started one week ago. Pt was  Seen at urgent care on Monday and prescribed Z-pack and tessalon pearls which she is taking as prescribed per report. Pt continues to have cough and sore throat. Pt vomited once today. Pt also given steroid shot at urgent care.

## 2017-07-02 NOTE — Discharge Instructions (Signed)
Follow-up with your primary care doctor.  Return here as needed °

## 2017-07-27 ENCOUNTER — Encounter: Payer: Self-pay | Admitting: Family

## 2017-07-27 ENCOUNTER — Ambulatory Visit: Payer: Medicaid Other | Admitting: Family

## 2017-07-27 ENCOUNTER — Ambulatory Visit (INDEPENDENT_AMBULATORY_CARE_PROVIDER_SITE_OTHER): Payer: Medicaid Other

## 2017-07-27 VITALS — BP 125/85 | HR 81 | Temp 97.3°F | Ht 65.0 in | Wt 211.8 lb

## 2017-07-27 DIAGNOSIS — M5441 Lumbago with sciatica, right side: Secondary | ICD-10-CM

## 2017-07-27 NOTE — Patient Instructions (Signed)
Sciatica Sciatica is pain, numbness, weakness, or tingling along the path of the sciatic nerve. The sciatic nerve starts in the lower back and runs down the back of each leg. The nerve controls the muscles in the lower leg and in the back of the knee. It also provides feeling (sensation) to the back of the thigh, the lower leg, and the sole of the foot. Sciatica is a symptom of another medical condition that pinches or puts pressure on the sciatic nerve. Generally, sciatica only affects one side of the body. Sciatica usually goes away on its own or with treatment. In some cases, sciatica may keep coming back (recur). What are the causes? This condition is caused by pressure on the sciatic nerve, or pinching of the sciatic nerve. This may be the result of:  A disk in between the bones of the spine (vertebrae) bulging out too far (herniated disk).  Age-related changes in the spinal disks (degenerative disk disease).  A pain disorder that affects a muscle in the buttock (piriformis syndrome).  Extra bone growth (bone spur) near the sciatic nerve.  An injury or break (fracture) of the pelvis.  Pregnancy.  Tumor (rare).  What increases the risk? The following factors may make you more likely to develop this condition:  Playing sports that place pressure or stress on the spine, such as football or weight lifting.  Having poor strength and flexibility.  A history of back injury.  A history of back surgery.  Sitting for long periods of time.  Doing activities that involve repetitive bending or lifting.  Obesity.  What are the signs or symptoms? Symptoms can vary from mild to very severe, and they may include:  Any of these problems in the lower back, leg, hip, or buttock: ? Mild tingling or dull aches. ? Burning sensations. ? Sharp pains.  Numbness in the back of the calf or the sole of the foot.  Leg weakness.  Severe back pain that makes movement difficult.  These  symptoms may get worse when you cough, sneeze, or laugh, or when you sit or stand for long periods of time. Being overweight may also make symptoms worse. In some cases, symptoms may recur over time. How is this diagnosed? This condition may be diagnosed based on:  Your symptoms.  A physical exam. Your health care provider may ask you to do certain movements to check whether those movements trigger your symptoms.  You may have tests, including: ? Blood tests. ? X-rays. ? MRI. ? CT scan.  How is this treated? In many cases, this condition improves on its own, without any treatment. However, treatment may include:  Reducing or modifying physical activity during periods of pain.  Exercising and stretching to strengthen your abdomen and improve the flexibility of your spine.  Icing and applying heat to the affected area.  Medicines that help: ? To relieve pain and swelling. ? To relax your muscles.  Injections of medicines that help to relieve pain, irritation, and inflammation around the sciatic nerve (steroids).  Surgery.  Follow these instructions at home: Medicines  Take over-the-counter and prescription medicines only as told by your health care provider.  Do not drive or operate heavy machinery while taking prescription pain medicine. Managing pain  If directed, apply ice to the affected area. ? Put ice in a plastic bag. ? Place a towel between your skin and the bag. ? Leave the ice on for 20 minutes, 2-3 times a day.  After icing, apply   heat to the affected area before you exercise or as often as told by your health care provider. Use the heat source that your health care provider recommends, such as a moist heat pack or a heating pad. ? Place a towel between your skin and the heat source. ? Leave the heat on for 20-30 minutes. ? Remove the heat if your skin turns bright red. This is especially important if you are unable to feel pain, heat, or cold. You may have a  greater risk of getting burned. Activity  Return to your normal activities as told by your health care provider. Ask your health care provider what activities are safe for you. ? Avoid activities that make your symptoms worse.  Take brief periods of rest throughout the day. Resting in a lying or standing position is usually better than sitting to rest. ? When you rest for longer periods, mix in some mild activity or stretching between periods of rest. This will help to prevent stiffness and pain. ? Avoid sitting for long periods of time without moving. Get up and move around at least one time each hour.  Exercise and stretch regularly, as told by your health care provider.  Do not lift anything that is heavier than 10 lb (4.5 kg) while you have symptoms of sciatica. When you do not have symptoms, you should still avoid heavy lifting, especially repetitive heavy lifting.  When you lift objects, always use proper lifting technique, which includes: ? Bending your knees. ? Keeping the load close to your body. ? Avoiding twisting. General instructions  Use good posture. ? Avoid leaning forward while sitting. ? Avoid hunching over while standing.  Maintain a healthy weight. Excess weight puts extra stress on your back and makes it difficult to maintain good posture.  Wear supportive, comfortable shoes. Avoid wearing high heels.  Avoid sleeping on a mattress that is too soft or too hard. A mattress that is firm enough to support your back when you sleep may help to reduce your pain.  Keep all follow-up visits as told by your health care provider. This is important. Contact a health care provider if:  You have pain that wakes you up when you are sleeping.  You have pain that gets worse when you lie down.  Your pain is worse than you have experienced in the past.  Your pain lasts longer than 4 weeks.  You experience unexplained weight loss. Get help right away if:  You lose control  of your bowel or bladder (incontinence).  You have: ? Weakness in your lower back, pelvis, buttocks, or legs that gets worse. ? Redness or swelling of your back. ? A burning sensation when you urinate. This information is not intended to replace advice given to you by your health care provider. Make sure you discuss any questions you have with your health care provider. Document Released: 02/03/2001 Document Revised: 07/16/2015 Document Reviewed: 10/19/2014 Elsevier Interactive Patient Education  2018 [employer]  

## 2017-07-27 NOTE — Progress Notes (Signed)
   Subjective:    Patient ID: Amy Noble, female    DOB: Feb 09, 1986, 32 y.o.   MRN: [id number]  Chief Complaint  Patient presents with  . lower back pain recheck    Back Pain  This is a chronic problem. The current episode started more than 1 month ago. The problem occurs intermittently. The problem has been waxing and waning since onset. The pain is present in the lumbar spine. The quality of the pain is described as aching. The pain is at a severity of 10/10. The pain is moderate. The pain is worse during the night. The symptoms are aggravated by bending and standing. Associated symptoms include leg pain, numbness, tingling and weakness. Pertinent negatives include no bladder incontinence or bowel incontinence. She has tried muscle relaxant, ice, NSAIDs and bed rest for the symptoms. The treatment provided mild relief.      Review of Systems  Gastrointestinal: Negative for bowel incontinence.  Genitourinary: Negative for bladder incontinence.  Musculoskeletal: Positive for back pain.  Neurological: Positive for tingling, weakness and numbness.  All other systems reviewed and are negative.      Objective:   Physical Exam  Constitutional: She is oriented to person, place, and time. She appears well-developed and well-nourished. No distress.  HENT:  Head: Normocephalic and atraumatic.  Right Ear: External ear normal.  Left Ear: External ear normal.  Mouth/Throat: Oropharynx is clear and moist.  Eyes: Pupils are equal, round, and reactive to light.  Neck: Normal range of motion. Neck supple. No thyromegaly present.  Cardiovascular: Normal rate, regular rhythm, normal heart sounds and intact distal pulses.  No murmur heard. Pulmonary/Chest: Effort normal and breath sounds normal. No respiratory distress. She has no wheezes.  Abdominal: Soft. Bowel sounds are normal. She exhibits no distension. There is no tenderness.  Musculoskeletal:  Full ROM, pain in lower lumbar with  flexion and extension  Neurological: She is alert and oriented to person, place, and time. She has normal reflexes. No cranial nerve deficit.  Skin: Skin is warm and dry.  Psychiatric: She has a normal mood and affect. Her behavior is normal. Judgment and thought content normal.  Vitals reviewed.     BP 125/85   Pulse 81   Temp (!) 97.3 F (36.3 C) (Oral)   Ht 5\' 5"  (1.651 m)   Wt 211 lb 12.8 oz (96.1 kg)   BMI 35.25 kg/m      Assessment & Plan:  Jeanice LimHolly was seen today for lower back pain recheck.  Diagnoses and all orders for this visit:  Acute bilateral low back pain with right-sided sciatica -     DG Lumbar Spine 2-3 Views; Future -     Ambulatory referral to Orthopedic Surgery   Will do referral to Ortho for chronic back pain Continue Suboxone  ROM exercises  Ice and heat Pt encouraged to schedule PT RTO prn    Jannifer Rodneyhristy Phoenicia Pirie, FNP

## 2019-01-16 ENCOUNTER — Emergency Department (HOSPITAL_COMMUNITY)
Admission: EM | Admit: 2019-01-16 | Discharge: 2019-01-16 | Disposition: A | Discharge status: Home / Self Care | Payer: Medicaid Other | Attending: Emergency Medicine | Admitting: Emergency Medicine

## 2019-01-16 ENCOUNTER — Encounter (HOSPITAL_COMMUNITY): Payer: Self-pay | Admitting: *Deleted

## 2019-01-16 ENCOUNTER — Emergency Department (HOSPITAL_COMMUNITY): Payer: Medicaid Other

## 2019-01-16 DIAGNOSIS — G8929 Other chronic pain: Secondary | ICD-10-CM | POA: Insufficient documentation

## 2019-01-16 DIAGNOSIS — Z79899 Other long term (current) drug therapy: Secondary | ICD-10-CM | POA: Diagnosis not present

## 2019-01-16 DIAGNOSIS — M545 Low back pain: Secondary | ICD-10-CM | POA: Insufficient documentation

## 2019-01-16 LAB — POC URINE PREG, ED: Preg Test, Ur: NEGATIVE

## 2019-01-16 MED ORDER — PREDNISONE 20 MG PO TABS: 40.0000 mg | ORAL_TABLET | Freq: Every day | ORAL | 0 refills | Status: AC

## 2019-01-16 MED ORDER — LIDOCAINE 5 % EX PTCH: 1.0000 | MEDICATED_PATCH | CUTANEOUS | 0 refills | Status: DC

## 2019-01-16 NOTE — ED Notes (Signed)
URINE PREGNANCY TEST IS NEGATIVE

## 2019-01-16 NOTE — Discharge Instructions (Signed)
Take prednisone as directed.  Use Lidoderm patches as directed.  As we discussed, follow-up with your primary care doctor.  Additionally, have provided an outpatient orthopedic doctor referral that you can follow-up with if your symptoms continue.  Return to the Emergency Department immediately for any worsening back pain, neck pain, difficulty walking, numbness/weaknss of your arms or legs, urinary or bowel accidents, fever or any other worsening or concerning symptoms.

## 2019-01-16 NOTE — ED Triage Notes (Signed)
Chronic back pain for years
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
## 2019-07-05 ENCOUNTER — Other Ambulatory Visit: Payer: Self-pay | Admitting: Neurology

## 2019-07-05 ENCOUNTER — Other Ambulatory Visit (HOSPITAL_COMMUNITY): Payer: Self-pay | Admitting: Neurology

## 2019-07-05 DIAGNOSIS — M545 Low back pain, unspecified: Secondary | ICD-10-CM

## 2019-07-26 ENCOUNTER — Ambulatory Visit (HOSPITAL_COMMUNITY): Payer: Medicaid Other

## 2019-08-08 ENCOUNTER — Encounter (HOSPITAL_COMMUNITY): Payer: Self-pay

## 2019-08-08 ENCOUNTER — Ambulatory Visit (HOSPITAL_COMMUNITY): Payer: Medicaid Other

## 2019-09-02 DIAGNOSIS — M62838 Other muscle spasm: Secondary | ICD-10-CM | POA: Diagnosis not present

## 2019-11-22 ENCOUNTER — Ambulatory Visit: Payer: Medicaid Other | Admitting: Physical Therapy

## 2019-11-27 ENCOUNTER — Ambulatory Visit: Payer: Medicaid Other | Attending: Nurse Practitioner | Admitting: Physical Therapy

## 2019-11-27 ENCOUNTER — Encounter: Payer: Self-pay | Admitting: Physical Therapy

## 2019-11-27 ENCOUNTER — Other Ambulatory Visit: Payer: Self-pay

## 2019-11-27 DIAGNOSIS — G8929 Other chronic pain: Secondary | ICD-10-CM

## 2019-11-27 DIAGNOSIS — M545 Low back pain, unspecified: Secondary | ICD-10-CM | POA: Insufficient documentation
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
## 2019-11-30 ENCOUNTER — Ambulatory Visit: Payer: Medicaid Other | Admitting: *Deleted

## 2019-11-30 ENCOUNTER — Other Ambulatory Visit: Payer: Self-pay

## 2019-11-30 DIAGNOSIS — M545 Low back pain, unspecified: Secondary | ICD-10-CM | POA: Diagnosis not present

## 2019-11-30 DIAGNOSIS — G8929 Other chronic pain: Secondary | ICD-10-CM

## 2019-11-30 NOTE — Therapy (Signed)
[facility] [address] [place], [place], [address] Phone: [phone]   Fax:  [phone]  Physical Therapy Treatment  Patient Details  Name: Amy Noble MRN: [id number] Date of Birth: [birth year] Referring Provider (PT): Roseanna Rainbow NP.   Encounter Date: 11/30/2019   PT End of Session - 11/30/19 1627    Visit Number 2    Number of Visits 12    Date for PT Re-Evaluation 01/08/20    PT Start Time 1430    PT Stop Time 1520    PT Time Calculation (min) 50 min           Past Medical History:  Diagnosis Date  . Chronic back pain     No past surgical history on file.  There were no vitals filed for this visit.   Subjective Assessment - 11/30/19 1513    Subjective COVID-19 screen performed prior to patient entering clinic.  Pt. reports doing ok, but having pain RT side SIJ    Limitations Standing    How long can you stand comfortably? varies.    Diagnostic tests MRI:             see flow sheet for Rx                             PT Long Term Goals - 11/27/19 1553      PT LONG TERM GOAL #1   Title Independent with a HEP.    Time 6    Period Weeks    Status New      PT LONG TERM GOAL #2   Title Perform ADL's with pain not > 3-4/10.    Baseline Pain can rise to 10/10 when performing ADL's.    Time 6    Period Weeks    Status New      PT LONG TERM GOAL #3   Title Stand  20 minutes with pain not > 3-4/10.    Baseline Pain can rise to severe levels with prolonged standing.    Time 6    Period Weeks    Status New                 Plan - 11/30/19 1629    Clinical Impression Statement Pt arrived today doing ok , but with pain in RT SIJ area. [place] combof/b STW to RT SIJ was performed with notable tenderness. MM energy techniques performed for posterior pelvic tilt    Examination-Activity Limitations Other    Examination-Participation Restrictions Other    Stability/Clinical Decision  Making Evolving/Moderate complexity    Rehab Potential Good    PT Frequency 2x / week    PT Duration 6 weeks    PT Treatment/Interventions ADLs/Self Care Home Management;Cryotherapy;Ultrasound;Moist Heat;Therapeutic activities;Therapeutic exercise;Manual techniques;Patient/family education;Passive range of motion;Dry needling;Joint Manipulations;Spinal Manipulations;Electrical Stimulation    PT Next Visit Plan Combo e'stim/US and STW/M over patient's right SIJ.  Core exercise progression.  Modalities as needed.    Consulted and Agree with Plan of Care Patient           Patient will benefit from skilled therapeutic intervention in order to improve the following deficits and impairments:  Pain, Decreased activity tolerance, Postural dysfunction  Visit Diagnosis: Chronic right-sided low back pain without sciatica     Problem List Patient Active Problem List   Diagnosis Date Noted  . Obesity (BMI 30-39.9) 05/24/2017  . Pregnancy complicated by Suboxone maintenance, antepartum (  HCC) 10/11/2015  . Drug dependence affecting pregnancy 10/11/2015    Sher Shampine,CHRIS, PTA 11/30/2019, 5:53 PM  [facility] [address] [place], [place], [address] Phone: [phone]   Fax:  [phone]  Name: GLENIS MUSOLF MRN: [id number] Date of Birth: [birth year]

## 2019-12-04 ENCOUNTER — Ambulatory Visit: Payer: Medicaid Other | Admitting: Physical Therapy

## 2019-12-04 ENCOUNTER — Other Ambulatory Visit: Payer: Self-pay

## 2019-12-04 DIAGNOSIS — M545 Low back pain, unspecified: Secondary | ICD-10-CM | POA: Diagnosis not present

## 2019-12-04 DIAGNOSIS — G8929 Other chronic pain: Secondary | ICD-10-CM | POA: Diagnosis not present

## 2019-12-04 NOTE — Therapy (Signed)
[facility] [address] [place], [place], [address] Phone: [phone]   Fax:  [phone]  Physical Therapy Treatment  Patient Details  Name: Amy Noble MRN: [id number] Date of Birth: Apr 06, 1985 Referring Provider (PT): Roseanna Rainbow NP.   Encounter Date: 12/04/2019   PT End of Session - 12/04/19 1505    Visit Number 3    Number of Visits 12    Date for PT Re-Evaluation 01/08/20    PT Start Time 0230    PT Stop Time 0315    PT Time Calculation (min) 45 min    Activity Tolerance Patient tolerated treatment well;Patient limited by pain    Behavior During Therapy [facility] for tasks assessed/performed           Past Medical History:  Diagnosis Date  . Chronic back pain     No past surgical history on file.  There were no vitals filed for this visit.   Subjective Assessment - 12/04/19 1434    Subjective COVID-19 screen performed prior to patient entering clinic.  Pt. reports doing ok after last treatment yet ongoing soreness in low back    Limitations Standing    How long can you stand comfortably? varies.    Diagnostic tests MRI:    Currently in Pain? Yes    Pain Score 7     Pain Location Back    Pain Orientation Right;Left;Lower   right more than left   Pain Descriptors / Indicators Discomfort    Pain Type Chronic pain    Pain Onset More than a month ago    Pain Frequency Constant    Aggravating Factors  lifting and activity    Pain Relieving Factors rest                             [facility] Adult PT Treatment/Exercise - 12/04/19 0001      Exercises   Exercises Lumbar      Lumbar Exercises: Supine   Ab Set 10 reps;3 seconds    Glut Set 10 reps;3 seconds    Bent Knee Raise 3 seconds   2x10   Straight Leg Raise 3 seconds   2x10   Straight Leg Raises Limitations        Moist Heat Therapy   Number Minutes Moist Heat 15 Minutes    Moist Heat Location Lumbar Spine      Electrical Stimulation    Electrical Stimulation Location RT SIJ    Electrical Stimulation Action premod    Electrical Stimulation Parameters 80-150hz  xmin    Electrical Stimulation Goals Pain      Ultrasound   Ultrasound Location Bil SIJ area    Ultrasound Parameters combo US/ES @ 1.5w/cm2/100%/12mhz x56min    Ultrasound Goals Pain                  PT Education - 12/04/19 1504    Education Details Core activation progression and posture awareness techniques    Person(s) Educated Patient    Methods Explanation;Demonstration;Handout    Comprehension Verbalized understanding;Returned demonstration               PT Long Term Goals - 12/04/19 1507      PT LONG TERM GOAL #1   Title Independent with a HEP.    Baseline issued initil HEP 12/04/19    Time 6    Period Weeks    Status On-going  PT LONG TERM GOAL #2   Title Perform ADL's with pain not > 3-4/10.    Baseline Pain can rise to 10/10 when performing ADL's.    Time 6    Period Weeks    Status On-going      PT LONG TERM GOAL #3   Title Stand  20 minutes with pain not > 3-4/10.    Baseline Pain can rise to severe levels with prolonged standing.    Time 6    Period Weeks    Status On-going                 Plan - 12/04/19 1508    Clinical Impression Statement Patient tolerated treatment fair due to ongoing increased pain. Today educated patient on posture awarness techniques and core activation and progression with HEP provided. Patient has increased pain with work activities and lifting and bending bil sides right more than left. Patient goals progressing.    Examination-Activity Limitations Other    Examination-Participation Restrictions Other    Stability/Clinical Decision Making Evolving/Moderate complexity    Rehab Potential Good    PT Frequency 2x / week    PT Duration 6 weeks    PT Treatment/Interventions ADLs/Self Care Home Management;Cryotherapy;Ultrasound;Moist Heat;Therapeutic activities;Therapeutic  exercise;Manual techniques;Patient/family education;Passive range of motion;Dry needling;Joint Manipulations;Spinal Manipulations;Electrical Stimulation    PT Next Visit Plan Combo e'stim/US and STW/M over patient's right SIJ.  Core exercise progression.  Modalities as needed.    Consulted and Agree with Plan of Care Patient           Patient will benefit from skilled therapeutic intervention in order to improve the following deficits and impairments:  Pain, Decreased activity tolerance, Postural dysfunction  Visit Diagnosis: Chronic right-sided low back pain without sciatica     Problem List Patient Active Problem List   Diagnosis Date Noted  . Obesity (BMI 30-39.9) 05/24/2017  . Pregnancy complicated by Suboxone maintenance, antepartum (HCC) 10/11/2015  . Drug dependence affecting pregnancy 10/11/2015    Hermelinda Dellen, PTA 12/04/2019, 3:18 PM  [facility] [address] [place], [place], [address] Phone: [phone]   Fax:  [phone]  Name: Amy Noble MRN: [id number] Date of Birth: [birth year]

## 2019-12-04 NOTE — Patient Instructions (Signed)
Pelvic Tilt: Posterior - Legs Bent (Supine)   Tighten stomach and flatten back by rolling pelvis down. Hold _10___ seconds. Relax. Repeat _10-30___ times per set. Do __2__ sets per session. Do _2___ sessions per day.   . Straight Leg Raise   Tighten stomach and slowly raise locked right leg __4__ inches from floor. Repeat __10-30__ times per set. Do __2__ sets per session. Do __2__ sessions per day.    Bent Leg Lift (Hook-Lying)   Tighten stomach and slowly raise right leg _5___ inches from floor. Keep trunk rigid. Hold _3___ seconds. Repeat _10___ times per set. Do ___2-3_ sets per session. Do __2__ sessions per day.  Brushing Teeth    Place one foot on ledge and one hand on counter. Bend other knee slightly to keep back straight.  Copyright  VHI. All rights reserved.  Refrigerator   Squat with knees apart to reach lower shelves and drawers.   Copyright  VHI. All rights reserved.  Laundry Basket   Squat down and hold basket close to stand. Use leg muscles to do the work.   Copyright  VHI. All rights reserved.  Housework - Vacuuming   Hold the vacuum with arm held at side. Step back and forth to move it, keeping head up. Avoid twisting.   Copyright  VHI. All rights reserved.  Housework - Wiping   Position yourself as close as possible to reach work surface. Avoid straining your back.   Copyright  VHI. All rights reserved.  Gardening - Mowing   Keep arms close to sides and walk with lawn mower.   Copyright  VHI. All rights reserved.  Sleeping on Side   Place pillow between knees. Use cervical support under neck and a roll around waist as needed.   Copyright  VHI. All rights reserved.  Log Roll   Lying on back, bend left knee and place left arm across chest. Roll all in one movement to the right. Reverse to roll to the left. Always move as one unit.   Copyright  VHI. All rights reserved.  Stand to Sit / Sit to Stand   To sit: Bend  knees to lower self onto front edge of chair, then scoot back on seat. To stand: Reverse sequence by placing one foot forward, and scoot to front of seat. Use rocking motion to stand up.  Copyright  VHI. All rights reserved.  Posture - Standing   Good posture is important. Avoid slouching and forward head thrust. Maintain curve in low back and align ears over shoul- ders, hips over ankles.   Copyright  VHI. All rights reserved.  Posture - Sitting   Sit upright, head facing forward. Try using a roll to support lower back. Keep shoulders relaxed, and avoid rounded back. Keep hips level with knees. Avoid crossing legs for long periods.   Copyright  VHI. All rights reserved.  Computer Work   Position work to face forward. Use proper work and seat height. Keep shoulders back and down, wrists straight, and elbows at right angles. Use chair that provides full back support. Add footrest and lumbar roll as needed.   Copyright  VHI. All rights reserved.     

## 2019-12-06 ENCOUNTER — Ambulatory Visit: Payer: Medicaid Other | Admitting: Physical Therapy

## 2019-12-06 ENCOUNTER — Other Ambulatory Visit: Payer: Self-pay

## 2019-12-06 DIAGNOSIS — M545 Low back pain, unspecified: Secondary | ICD-10-CM

## 2019-12-06 DIAGNOSIS — G8929 Other chronic pain: Secondary | ICD-10-CM

## 2019-12-06 NOTE — Therapy (Signed)
[facility] [address] [place], [place], [address] Phone: [phone]   Fax:  [phone]  Physical Therapy Treatment  Patient Details  Name: Amy Noble MRN: [id number] Date of Birth: 01-07-1986 Referring Provider (PT): Roseanna Rainbow NP.   Encounter Date: 12/06/2019   PT End of Session - 12/06/19 1159    Visit Number 4    Number of Visits 12    Date for PT Re-Evaluation 01/08/20    PT Start Time 1119    PT Stop Time 1212    PT Time Calculation (min) 53 min    Activity Tolerance Patient tolerated treatment well    Behavior During Therapy [facility] for tasks assessed/performed           Past Medical History:  Diagnosis Date  . Chronic back pain     No past surgical history on file.  There were no vitals filed for this visit.   Subjective Assessment - 12/06/19 1121    Subjective COVID-19 screen performed prior to patient entering clinic.  Patient arrived with less discomfort and did well after last treatment    Limitations Standing    How long can you stand comfortably? varies.    Diagnostic tests MRI:    Currently in Pain? Yes    Pain Score 6     Pain Location Back    Pain Orientation Right;Left;Lower    Pain Descriptors / Indicators Discomfort    Pain Type Chronic pain    Pain Onset More than a month ago    Pain Frequency Constant    Aggravating Factors  lifting and incresed activity    Pain Relieving Factors at rest                             [facility] Adult PT Treatment/Exercise - 12/06/19 0001      Lumbar Exercises: Aerobic   Nustep L2 x4min UE/LE activity, core focus      Lumbar Exercises: Standing   Row Strengthening;Both;20 reps;Theraband    Theraband Level (Row) Other (comment)    Row Limitations Blue XTS    Shoulder Extension Strengthening;Both;20 reps;Theraband    Theraband Level (Shoulder Extension) Other (comment)    Shoulder Extension Limitations Blue XTS      Lumbar Exercises: Supine     Bent Knee Raise 3 seconds   2x10   Bridge 10 reps    Straight Leg Raise 3 seconds   2x10   Other Supine Lumbar Exercises hip abd with red band x20 core focus      Moist Heat Therapy   Number Minutes Moist Heat 15 Minutes    Moist Heat Location Lumbar Spine      Electrical Stimulation   Electrical Stimulation Location RT SIJ    Electrical Stimulation Action premod    Electrical Stimulation Parameters 80-150hz  x26min    Electrical Stimulation Goals Pain      Ultrasound   Ultrasound Location Bil SIJ area    Ultrasound Parameters combo US/ES @ 1.5w/cm2/100%/68mhz x22min     Ultrasound Goals Pain                       PT Long Term Goals - 12/04/19 1507      PT LONG TERM GOAL #1   Title Independent with a HEP.    Baseline issued initil HEP 12/04/19    Time 6    Period Weeks    Status On-going  PT LONG TERM GOAL #2   Title Perform ADL's with pain not > 3-4/10.    Baseline Pain can rise to 10/10 when performing ADL's.    Time 6    Period Weeks    Status On-going      PT LONG TERM GOAL #3   Title Stand  20 minutes with pain not > 3-4/10.    Baseline Pain can rise to severe levels with prolonged standing.    Time 6    Period Weeks    Status On-going                 Plan - 12/06/19 1202    Clinical Impression Statement Patient tolerated treatment well today. Patient reported less pain and able to progress with core progression today. Patient had good form with core activities and no increased pain. Patient goals progressing this week.    Examination-Activity Limitations Other    Examination-Participation Restrictions Other    Stability/Clinical Decision Making Evolving/Moderate complexity    Rehab Potential Good    PT Frequency 2x / week    PT Duration 6 weeks    PT Treatment/Interventions ADLs/Self Care Home Management;Cryotherapy;Ultrasound;Moist Heat;Therapeutic activities;Therapeutic exercise;Manual techniques;Patient/family education;Passive  range of motion;Dry needling;Joint Manipulations;Spinal Manipulations;Electrical Stimulation    PT Next Visit Plan Combo e'stim/US and STW/M over patient's right SIJ.  Core exercise progression.  Modalities as needed.    Consulted and Agree with Plan of Care Patient           Patient will benefit from skilled therapeutic intervention in order to improve the following deficits and impairments:  Pain, Decreased activity tolerance, Postural dysfunction  Visit Diagnosis: Chronic right-sided low back pain without sciatica     Problem List Patient Active Problem List   Diagnosis Date Noted  . Obesity (BMI 30-39.9) 05/24/2017  . Pregnancy complicated by Suboxone maintenance, antepartum (HCC) 10/11/2015  . Drug dependence affecting pregnancy 10/11/2015    Hermelinda Dellen, PTA 12/06/2019, 12:15 PM  [facility] [address] [place], [place], [address] Phone: [phone]   Fax:  [phone]  Name: Amy Noble MRN: [id number] Date of Birth: 1985/08/14

## 2019-12-12 ENCOUNTER — Other Ambulatory Visit: Payer: Self-pay

## 2019-12-12 ENCOUNTER — Ambulatory Visit: Payer: Medicaid Other | Admitting: *Deleted

## 2019-12-12 DIAGNOSIS — G8929 Other chronic pain: Secondary | ICD-10-CM | POA: Diagnosis not present

## 2019-12-12 DIAGNOSIS — M545 Low back pain, unspecified: Secondary | ICD-10-CM | POA: Diagnosis not present

## 2019-12-12 NOTE — Therapy (Signed)
[facility] [address] [place], [place], [address] Phone: [phone]   Fax:  [phone]  Physical Therapy Treatment  Patient Details  Name: Amy Noble MRN: [id number] Date of Birth: 07/24/1985 Referring Provider (PT): Roseanna Rainbow NP.   Encounter Date: 12/12/2019   PT End of Session - 12/12/19 1647    Visit Number 5    Number of Visits 12    Date for PT Re-Evaluation 01/08/20    PT Start Time 1035    PT Stop Time 1122    PT Time Calculation (min) 47 min           Past Medical History:  Diagnosis Date  . Chronic back pain     No past surgical history on file.  There were no vitals filed for this visit.   Subjective Assessment - 12/12/19 1044    Subjective COVID-19 screen performed prior to patient entering clinic.  Patient arrived with less discomfort and did well after last treatment and mainly RT side today    Limitations Standing    How long can you stand comfortably? varies.    Diagnostic tests MRI:    Pain Score 6     Pain Location Back    Pain Orientation Right    Pain Descriptors / Indicators Discomfort    Pain Onset More than a month ago                             [facility] Adult PT Treatment/Exercise - 12/12/19 0001      Exercises   Exercises Lumbar      Lumbar Exercises: Aerobic   Nustep L4 x31min UE/LE activity, core focus      Lumbar Exercises: Standing   Shoulder Extension Strengthening;Both;20 reps;Theraband    Theraband Level (Shoulder Extension) Other (comment)    Shoulder Extension Limitations Blue XTS      Moist Heat Therapy   Number Minutes Moist Heat 15 Minutes    Moist Heat Location Lumbar Spine      Electrical Stimulation   Electrical Stimulation Location RT SIJ    Electrical Stimulation Action premod 80-150hz  x 15 mins    Electrical Stimulation Parameters prone    Electrical Stimulation Goals Pain      Ultrasound   Ultrasound Location RT SIJ/ LB    Ultrasound  Parameters Combo US/ estim x 12 mins     Ultrasound Goals Pain      Manual Therapy   Manual Therapy Soft tissue mobilization    Soft tissue mobilization STW to RT SIJ and QLin prone position                       PT Long Term Goals - 12/04/19 1507      PT LONG TERM GOAL #1   Title Independent with a HEP.    Baseline issued initil HEP 12/04/19    Time 6    Period Weeks    Status On-going      PT LONG TERM GOAL #2   Title Perform ADL's with pain not > 3-4/10.    Baseline Pain can rise to 10/10 when performing ADL's.    Time 6    Period Weeks    Status On-going      PT LONG TERM GOAL #3   Title Stand  20 minutes with pain not > 3-4/10.    Baseline Pain can rise to severe levels with  prolonged standing.    Time 6    Period Weeks    Status On-going                 Plan - 12/12/19 1649    Clinical Impression Statement Pt arrived today doing ok, but having 6/10 pain RT side SIJ. Core strengthening in standing was performed f/b STW and [place] combo to RT side LB SIJ and TPR to RT QL all in prone position.  Decreased pain after Rx    Examination-Activity Limitations Other    Examination-Participation Restrictions Other    Stability/Clinical Decision Making Evolving/Moderate complexity    Rehab Potential Good    PT Frequency 2x / week    PT Duration 6 weeks    PT Treatment/Interventions ADLs/Self Care Home Management;Cryotherapy;Ultrasound;Moist Heat;Therapeutic activities;Therapeutic exercise;Manual techniques;Patient/family education;Passive range of motion;Dry needling;Joint Manipulations;Spinal Manipulations;Electrical Stimulation    PT Next Visit Plan Combo e'stim/US and STW/M over patient's right SIJ.  Core exercise progression.  Modalities as needed.           Patient will benefit from skilled therapeutic intervention in order to improve the following deficits and impairments:  Pain, Decreased activity tolerance, Postural dysfunction  Visit  Diagnosis: Chronic right-sided low back pain without sciatica     Problem List Patient Active Problem List   Diagnosis Date Noted  . Obesity (BMI 30-39.9) 05/24/2017  . Pregnancy complicated by Suboxone maintenance, antepartum (HCC) 10/11/2015  . Drug dependence affecting pregnancy 10/11/2015    [name],CHRIS, PTA 12/12/2019, 5:00 PM  [facility] [address] [place], [place], [address] Phone: [phone]   Fax:  [phone]  Name: Amy Noble MRN: [id number] Date of Birth: April 11, 1985

## 2019-12-15 ENCOUNTER — Encounter: Payer: Medicaid Other | Admitting: *Deleted

## 2019-12-18 ENCOUNTER — Ambulatory Visit: Payer: Medicaid Other | Admitting: Physical Therapy

## 2019-12-18 ENCOUNTER — Other Ambulatory Visit: Payer: Self-pay

## 2019-12-18 DIAGNOSIS — M545 Low back pain, unspecified: Secondary | ICD-10-CM | POA: Diagnosis not present

## 2019-12-18 DIAGNOSIS — G8929 Other chronic pain: Secondary | ICD-10-CM | POA: Diagnosis not present

## 2019-12-18 NOTE — Therapy (Signed)
[facility] [address] [place], [place], [address] Phone: [phone]   Fax:  [phone]  Physical Therapy Treatment  Patient Details  Name: EBONYE READE MRN: [id number] Date of Birth: 1985-10-31 Referring Provider (PT): Roseanna Rainbow NP.   Encounter Date: 12/18/2019   PT End of Session - 12/18/19 1353    Visit Number 6    Number of Visits 12    Date for PT Re-Evaluation 01/08/20    PT Start Time 0150    PT Stop Time 0245    PT Time Calculation (min) 55 min    Activity Tolerance Patient tolerated treatment well    Behavior During Therapy [facility] for tasks assessed/performed           Past Medical History:  Diagnosis Date  . Chronic back pain     No past surgical history on file.  There were no vitals filed for this visit.   Subjective Assessment - 12/18/19 1352    Subjective COVID-19 screen performed prior to patient entering clinic.  Patient arrived with some ongoing discomfort    Limitations Standing    How long can you stand comfortably? varies.    Diagnostic tests MRI:    Currently in Pain? Yes    Pain Score 6     Pain Location Back    Pain Orientation Right    Pain Descriptors / Indicators Discomfort    Pain Type Chronic pain    Pain Onset More than a month ago    Pain Frequency Constant    Aggravating Factors  lifting/increased activity    Pain Relieving Factors rest                             [facility] Adult PT Treatment/Exercise - 12/18/19 0001      Lumbar Exercises: Stretches   Single Knee to Chest Stretch 2 reps;Right;Left;20 seconds    Piriformis Stretch Left;Right;2 reps;20 seconds      Lumbar Exercises: Aerobic   Nustep L3 x99min UE/LE      Lumbar Exercises: Supine   Bent Knee Raise 3 seconds   2x10   Bridge with clamshell 15 reps   red band   Straight Leg Raise 3 seconds   2x10   Other Supine Lumbar Exercises ball squeeze 10sec x20reps      Moist Heat Therapy   Number Minutes  Moist Heat 15 Minutes    Moist Heat Location Lumbar Spine      Electrical Stimulation   Electrical Stimulation Location low back    Electrical Stimulation Action IFC    Electrical Stimulation Parameters 80-150hz  x60min    Electrical Stimulation Goals Pain      Ultrasound   Ultrasound Location bil SIJ/LB    Ultrasound Parameters [place] @1 .5w/cm2/100%/6mhz x24min    Ultrasound Goals Pain                       PT Long Term Goals - 12/18/19 1354      PT LONG TERM GOAL #1   Title Independent with a HEP.    Baseline issued initil HEP 12/04/19    Time 6    Period Weeks    Status On-going      PT LONG TERM GOAL #2   Title Perform ADL's with pain not > 3-4/10.    Baseline 6-7/10 12/18/19    Time 6    Period Weeks    Status On-going  PT LONG TERM GOAL #3   Title Stand  20 minutes with pain not > 3-4/10.    Baseline 7/10 reported 12/18/19    Time 6    Period Weeks    Status On-going                 Plan - 12/18/19 1433    Clinical Impression Statement Patient tolerated treatment well today. Patient able to progress with low back strengthening and LE stretches due to reported tightness and discomfort. Patient has reported 6-7/10 pain with ADL's and standing in bil low back yet more in Right side. Patient current goals ongpoing yet progressing.    Examination-Activity Limitations Other    Examination-Participation Restrictions Other    Stability/Clinical Decision Making Evolving/Moderate complexity    Rehab Potential Good    PT Frequency 2x / week    PT Duration 6 weeks    PT Treatment/Interventions ADLs/Self Care Home Management;Cryotherapy;Ultrasound;Moist Heat;Therapeutic activities;Therapeutic exercise;Manual techniques;Patient/family education;Passive range of motion;Dry needling;Joint Manipulations;Spinal Manipulations;Electrical Stimulation    PT Next Visit Plan cont with POC for core exercise progression.  Modalities as needed.    Consulted and Agree  with Plan of Care Patient           Patient will benefit from skilled therapeutic intervention in order to improve the following deficits and impairments:  Pain, Decreased activity tolerance, Postural dysfunction  Visit Diagnosis: Chronic right-sided low back pain without sciatica     Problem List Patient Active Problem List   Diagnosis Date Noted  . Obesity (BMI 30-39.9) 05/24/2017  . Pregnancy complicated by Suboxone maintenance, antepartum (HCC) 10/11/2015  . Drug dependence affecting pregnancy 10/11/2015    Hermelinda Dellen, PTA 12/18/2019, 2:59 PM  [facility] [address] [place], [place], [address] Phone: [phone]   Fax:  [phone]  Name: CHARLOTT CALVARIO MRN: [id number] Date of Birth: [birth year]

## 2019-12-21 ENCOUNTER — Ambulatory Visit: Payer: Medicaid Other | Admitting: Physical Therapy

## 2019-12-21 DIAGNOSIS — G8929 Other chronic pain: Secondary | ICD-10-CM

## 2019-12-21 DIAGNOSIS — M545 Low back pain, unspecified: Secondary | ICD-10-CM | POA: Diagnosis not present

## 2019-12-21 NOTE — Therapy (Signed)
[facility] [address] [place], [place], [address] Phone: [phone]   Fax:  [phone]  Physical Therapy Treatment  Patient Details  Name: Amy Noble MRN: [id number] Date of Birth: [birth year] Referring Provider (PT): Roseanna Rainbow NP.   Encounter Date: 12/21/2019   PT End of Session - 12/21/19 1613    Visit Number 7    Number of Visits 12    Date for PT Re-Evaluation 01/08/20    PT Start Time 1604    PT Stop Time 1648    PT Time Calculation (min) 44 min    Activity Tolerance Patient tolerated treatment well    Behavior During Therapy [facility] for tasks assessed/performed           Past Medical History:  Diagnosis Date  . Chronic back pain     No past surgical history on file.  There were no vitals filed for this visit.   Subjective Assessment - 12/21/19 1605    Subjective COVID-19 screen performed prior to patient entering clinic.  Reports feeling better but still 5/10 pain R>L. States she's been experiencing more R sided abdominal pain.    Limitations Standing    How long can you stand comfortably? varies.    Diagnostic tests MRI:    Currently in Pain? Yes    Pain Score 5     Pain Location Back    Pain Orientation Right    Pain Descriptors / Indicators Discomfort    Pain Type Chronic pain    Pain Onset More than a month ago    Pain Frequency Constant              [facility] PT Assessment - 12/21/19 0001      Assessment   Medical Diagnosis Lumbar Spondylosis.    Referring Provider (PT) Roseanna Rainbow NP.      Precautions   Precaution Comments No lifting over 10#.      Restrictions   Weight Bearing Restrictions No                         [facility] Adult PT Treatment/Exercise - 12/21/19 0001      Exercises   Exercises Lumbar      Lumbar Exercises: Stretches   Single Knee to Chest Stretch 2 reps;Right;Left;20 seconds    Lower Trunk Rotation 3 reps;20 seconds      Lumbar Exercises: Aerobic   Nustep L3  x1min UE/LE      Lumbar Exercises: Standing   Row Strengthening;Both;20 reps;Theraband    Row Limitations Blue XTS    Shoulder Extension Strengthening;Both;20 reps;Theraband    Shoulder Extension Limitations Blue XTS      Moist Heat Therapy   Number Minutes Moist Heat 15 Minutes    Moist Heat Location Lumbar Spine      Electrical Stimulation   Electrical Stimulation Location low back    Electrical Stimulation Action IFC    Electrical Stimulation Parameters 80-150 hz x15 misn    Electrical Stimulation Goals Pain      Ultrasound   Ultrasound Location bilateral SIJ/low back    Ultrasound Parameters combo US/E-stim 100% 1.5 w/cm2 1 mhz x10 mins    Ultrasound Goals Pain                       PT Long Term Goals - 12/18/19 1354      PT LONG TERM GOAL #1   Title Independent with a HEP.  Baseline issued initil HEP 12/04/19    Time 6    Period Weeks    Status On-going      PT LONG TERM GOAL #2   Title Perform ADL's with pain not > 3-4/10.    Baseline 6-7/10 12/18/19    Time 6    Period Weeks    Status On-going      PT LONG TERM GOAL #3   Title Stand  20 minutes with pain not > 3-4/10.    Baseline 7/10 reported 12/18/19    Time 6    Period Weeks    Status On-going                 Plan - 12/21/19 1645    Clinical Impression Statement Patient responded well to therapy session with progression of TEs. Patient required intermittent verbal cuing for form and technique with good response for remaining reps. Patient responded well to combo e-stim/us with reports of decreased pain. Patient instructed to call MD in regards to lower R abdominal pain which reported understanding. Patient with normal response upon removal of modalities.    Examination-Activity Limitations Other    Examination-Participation Restrictions Other    Stability/Clinical Decision Making Evolving/Moderate complexity    Clinical Decision Making Low    Rehab Potential Good    PT  Frequency 2x / week    PT Duration 6 weeks    PT Treatment/Interventions ADLs/Self Care Home Management;Cryotherapy;Ultrasound;Moist Heat;Therapeutic activities;Therapeutic exercise;Manual techniques;Patient/family education;Passive range of motion;Dry needling;Joint Manipulations;Spinal Manipulations;Electrical Stimulation    PT Next Visit Plan cont with POC for core exercise progression.  Modalities as needed.    Consulted and Agree with Plan of Care Patient           Patient will benefit from skilled therapeutic intervention in order to improve the following deficits and impairments:  Pain, Decreased activity tolerance, Postural dysfunction  Visit Diagnosis: Chronic right-sided low back pain without sciatica     Problem List Patient Active Problem List   Diagnosis Date Noted  . Obesity (BMI 30-39.9) 05/24/2017  . Pregnancy complicated by Suboxone maintenance, antepartum ([facility]) 10/11/2015  . Drug dependence affecting pregnancy 10/11/2015    Guss Bunde, PT, DPT 12/21/2019, 5:08 PM  [facility] [address] [place], [place], [address] Phone: [phone]   Fax:  [phone]  Name: Amy Noble MRN: [id number] Date of Birth: [birth year]

## 2019-12-27 ENCOUNTER — Other Ambulatory Visit: Payer: Self-pay

## 2019-12-27 ENCOUNTER — Ambulatory Visit: Payer: Medicaid Other | Attending: Nurse Practitioner | Admitting: Physical Therapy

## 2019-12-27 DIAGNOSIS — M545 Low back pain, unspecified: Secondary | ICD-10-CM | POA: Insufficient documentation

## 2019-12-27 DIAGNOSIS — G8929 Other chronic pain: Secondary | ICD-10-CM | POA: Diagnosis present

## 2019-12-27 NOTE — Patient Instructions (Signed)
  ELASTIC BAND SHOULDER DIAGONAL  EXT - ABD  While holding an elastic band across the upper half of your body, pull the band downward and across towards your other side. Your hand should start in the thumb-up position and end in the thumb-down position.  Can use both hands together with core activation on both sides   Standing lat pull with theraband  Anchor bands higher than your head. Start with your arms straight out in front of you at shoulder height (or a little above).  Pull bands down next to your body and then slowly return to the starting position.  10-30 x1day    Bridging with Theraband  Begin by lying on your back with your knees bent and theraband around your knees. Tighten your abs by tilting your pelvis up and flattening your back on the mat. Squeeze your glutes and raise your hips off of the table towards the ceiling. Keeping your hips raised, pull your knees outward against the band. Relax your knees back to midline and slowly return your hips to the mat. Relax and Breathe 2x10 1x daily    

## 2019-12-27 NOTE — Therapy (Signed)
[facility] [place], [place], [address] Phone: [phone]   Fax:  [phone]  Physical Therapy Treatment  Patient Details  Name: Amy Noble MRN: [id number] Date of Birth: [birth year] Referring Provider (PT): Elfredia Nevins NP.   Encounter Date: 12/27/2019   PT End of Session - 12/27/19 1309    Visit Number 8    Number of Visits 12    Date for PT Re-Evaluation 01/08/20    PT Start Time 0106    PT Stop Time 0154    PT Time Calculation (min) 48 min    Activity Tolerance Patient tolerated treatment well    Behavior During Therapy [facility] for tasks assessed/performed           Past Medical History:  Diagnosis Date  . Chronic back pain     No past surgical history on file.  There were no vitals filed for this visit.   Subjective Assessment - 12/27/19 1308    Subjective COVID-19 screen performed prior to patient entering clinic. Patient arrivd with increased pain for unknown reason.    Limitations Standing    How long can you stand comfortably? varies.    Diagnostic tests MRI:    Currently in Pain? Yes    Pain Score 8     Pain Location Back    Pain Orientation Right;Lower    Pain Descriptors / Indicators Discomfort    Pain Type Chronic pain    Pain Onset More than a month ago    Pain Frequency Constant    Aggravating Factors  increased activity    Pain Relieving Factors at rest                             [facility] Adult PT Treatment/Exercise - 12/27/19 0001      Lumbar Exercises: Aerobic   Nustep L3 x66mn UE/LE      Lumbar Exercises: Standing   Shoulder Extension Strengthening;Both;20 reps;Theraband    Shoulder Extension Limitations Blue XTS      Lumbar Exercises: Supine   Bridge with clamshell 15 reps   green t-band   Straight Leg Raise 3 seconds   2x10     Moist Heat Therapy   Number Minutes Moist Heat 15 Minutes    Moist Heat Location Lumbar Spine      Electrical Stimulation    Electrical Stimulation Location low back    Electrical Stimulation Action IFC    Electrical Stimulation Parameters 80-'150hz'  x1729m    Electrical Stimulation Goals Pain      Ultrasound   Ultrasound Location bil SIJ/LB    Ultrasound Parameters USKore'@1' .5w/cm2/100%/29m18mx12m96m  Ultrasound Goals Pain                  PT Education - 12/27/19 1324    Education Details HEP progression    Person(s) Educated Patient    Methods Explanation;Demonstration;Handout    Comprehension Verbalized understanding;Returned demonstration               PT Long Term Goals - 12/27/19 1309      PT LONG TERM GOAL #1   Title Independent with a HEP.    Baseline met 12/27/19    Time 6    Period Weeks    Status Achieved      PT LONG TERM GOAL #2   Title Perform ADL's with pain not > 3-4/10.    Baseline increased pain today  and ongoing with ADL's 12/27/19    Time 6    Period Weeks    Status On-going      PT LONG TERM GOAL #3   Title Stand  20 minutes with pain not > 3-4/10.    Time 6    Period Weeks    Status On-going                 Plan - 12/27/19 1344    Clinical Impression Statement Patient tolerated treatment fair due to increased pain today. Patient issued HEP for home progression with green t-band. Patient continues to have days with less pain yet increased pain with prolong walking, standing and ADL's. LTG #1 met with others ongoing.    Examination-Activity Limitations Other    Examination-Participation Restrictions Other    Stability/Clinical Decision Making Evolving/Moderate complexity    Rehab Potential Good    PT Frequency 2x / week    PT Duration 6 weeks    PT Treatment/Interventions ADLs/Self Care Home Management;Cryotherapy;Ultrasound;Moist Heat;Therapeutic activities;Therapeutic exercise;Manual techniques;Patient/family education;Passive range of motion;Dry needling;Joint Manipulations;Spinal Manipulations;Electrical Stimulation    PT Next Visit Plan cont with POC  for core exercise progression.  Modalities as needed.    Consulted and Agree with Plan of Care Patient           Patient will benefit from skilled therapeutic intervention in order to improve the following deficits and impairments:  Pain, Decreased activity tolerance, Postural dysfunction  Visit Diagnosis: Chronic right-sided low back pain without sciatica     Problem List Patient Active Problem List   Diagnosis Date Noted  . Obesity (BMI 30-39.9) 05/24/2017  . Pregnancy complicated by Suboxone maintenance, antepartum ([place]) 10/11/2015  . Drug dependence affecting pregnancy 10/11/2015    Phillips Climes, PTA 12/27/2019, 2:08 PM  [facility] [place], [place], [address] Phone: [phone]   Fax:  [phone]  Name: Amy Noble MRN: [id number] Date of Birth: 06-Jul-1985

## 2019-12-29 ENCOUNTER — Other Ambulatory Visit: Payer: Self-pay

## 2019-12-29 ENCOUNTER — Ambulatory Visit: Payer: Medicaid Other | Admitting: Physical Therapy

## 2019-12-29 DIAGNOSIS — M545 Low back pain, unspecified: Secondary | ICD-10-CM | POA: Diagnosis not present

## 2019-12-29 DIAGNOSIS — G8929 Other chronic pain: Secondary | ICD-10-CM

## 2019-12-29 NOTE — Therapy (Signed)
[facility] [place], [place], [address] Phone: [phone]   Fax:  [phone]  Physical Therapy Treatment  Patient Details  Name: Amy Noble MRN: [id number] Date of Birth: [birth year] Referring Provider (PT): Elfredia Nevins NP.   Encounter Date: 12/29/2019   PT End of Session - 12/29/19 1123    Visit Number 9    Number of Visits 12    Date for PT Re-Evaluation 01/08/20    PT Start Time 5697    PT Stop Time 1128    PT Time Calculation (min) 53 min    Activity Tolerance Patient tolerated treatment well    Behavior During Therapy [facility] for tasks assessed/performed           Past Medical History:  Diagnosis Date  . Chronic back pain     No past surgical history on file.  There were no vitals filed for this visit.   Subjective Assessment - 12/29/19 1123    Subjective COVID-19 screen performed prior to patient entering clinic.  Pain about a 5 today.    Limitations Standing    How long can you stand comfortably? varies.    Diagnostic tests MRI:    Currently in Pain? Yes    Pain Score 5     Pain Location Back    Pain Orientation Right;Lower    Pain Descriptors / Indicators Discomfort    Pain Type Chronic pain    Pain Onset More than a month ago                             [facility] Adult PT Treatment/Exercise - 12/29/19 0001      Exercises   Exercises Knee/Hip      Lumbar Exercises: Aerobic   Nustep Level 3 x 10 minutes.      Moist Heat Therapy   Number Minutes Moist Heat 20 Minutes    Moist Heat Location Lumbar Spine      Electrical Stimulation   Electrical Stimulation Location RT SIJ region.    Electrical Stimulation Action Pre-mod.    Electrical Stimulation Parameters 80-150 Hz x 20 minutes.    Electrical Stimulation Goals Pain      Ultrasound   Ultrasound Location Left sdly position with folded pillow betwen knees for comfort to right SIJ region.    Ultrasound Parameters Combo  e'stim/US at 1.50 W/CM2 x 7 minutes.    Ultrasound Goals Pain      Manual Therapy   Manual Therapy Soft tissue mobilization    Soft tissue mobilization STW/M x 6 minuutes to right SIJ region.                       PT Long Term Goals - 12/27/19 1309      PT LONG TERM GOAL #1   Title Independent with a HEP.    Baseline met 12/27/19    Time 6    Period Weeks    Status Achieved      PT LONG TERM GOAL #2   Title Perform ADL's with pain not > 3-4/10.    Baseline increased pain today and ongoing with ADL's 12/27/19    Time 6    Period Weeks    Status On-going      PT LONG TERM GOAL #3   Title Stand  20 minutes with pain not > 3-4/10.    Time 6    Period Weeks  Status On-going                 Plan - 12/29/19 1134    Clinical Impression Statement Patient feels treatments are helping.  Her pain is a 5 today.  Her leg lengths were equal today.    Examination-Activity Limitations Other    Examination-Participation Restrictions Other    Stability/Clinical Decision Making Evolving/Moderate complexity    Rehab Potential Good    PT Frequency 2x / week    PT Duration 6 weeks    PT Treatment/Interventions ADLs/Self Care Home Management;Cryotherapy;Ultrasound;Moist Heat;Therapeutic activities;Therapeutic exercise;Manual techniques;Patient/family education;Passive range of motion;Dry needling;Joint Manipulations;Spinal Manipulations;Electrical Stimulation    PT Next Visit Plan cont with POC for core exercise progression.  Modalities as needed.    Consulted and Agree with Plan of Care Patient           Patient will benefit from skilled therapeutic intervention in order to improve the following deficits and impairments:  Pain, Decreased activity tolerance, Postural dysfunction  Visit Diagnosis: Chronic right-sided low back pain without sciatica     Problem List Patient Active Problem List   Diagnosis Date Noted  . Obesity (BMI 30-39.9) 05/24/2017  .  Pregnancy complicated by Suboxone maintenance, antepartum ([place]) 10/11/2015  . Drug dependence affecting pregnancy 10/11/2015    Airelle Everding, [place] MPT 12/29/2019, 11:45 AM  [facility] [address] [place], [place], [address] Phone: [phone]   Fax:  [phone]  Name: Amy Noble MRN: [id number] Date of Birth: [birth year]

## 2020-01-01 ENCOUNTER — Encounter: Payer: Self-pay | Admitting: Physical Therapy

## 2020-01-01 ENCOUNTER — Other Ambulatory Visit: Payer: Self-pay

## 2020-01-01 ENCOUNTER — Ambulatory Visit: Payer: Medicaid Other | Admitting: Physical Therapy

## 2020-01-01 DIAGNOSIS — G8929 Other chronic pain: Secondary | ICD-10-CM

## 2020-01-01 DIAGNOSIS — M545 Low back pain, unspecified: Secondary | ICD-10-CM | POA: Diagnosis not present

## 2020-01-01 NOTE — Therapy (Signed)
[facility] [place], [place], [address] Phone: [phone]   Fax:  [phone]  Physical Therapy Treatment Progress Note Reporting Period 11/27/2019 to 12/01/2019  See note below for Objective Data and Assessment of Progress/Goals. Patient with ongoing pain in low back especially with work activities. Goals ongoing at this time. Gabriela Eves, PT, DPT  Patient Details  Name: Amy Noble MRN: [id number] Date of Birth: 22-Jul-1985 Referring Provider (PT): Elfredia Nevins NP.   Encounter Date: 01/01/2020   PT End of Session - 01/01/20 1526    Visit Number 10    Number of Visits 12    Date for PT Re-Evaluation 01/08/20    PT Start Time 1430    PT Stop Time 1517    PT Time Calculation (min) 47 min    Activity Tolerance Patient tolerated treatment well    Behavior During Therapy [facility] for tasks assessed/performed           Past Medical History:  Diagnosis Date  . Chronic back pain     History reviewed. No pertinent surgical history.  There were no vitals filed for this visit.   Subjective Assessment - 01/01/20 1434    Subjective COVID-19 screen performed prior to patient entering clinic.  Patient reports more lifting at work. 8/10 pain.    Limitations Standing    How long can you stand comfortably? varies.    Diagnostic tests MRI:    Currently in Pain? Yes    Pain Score 8     Pain Location Back    Pain Orientation Right;Lower    Pain Descriptors / Indicators Discomfort    Pain Type Chronic pain    Pain Onset More than a month ago    Pain Frequency Constant              [facility] PT Assessment - 01/01/20 0001      Assessment   Medical Diagnosis Lumbar Spondylosis.    Referring Provider (PT) Elfredia Nevins NP.      Precautions   Precaution Comments No lifting over 10#.      Restrictions   Weight Bearing Restrictions No                         [facility] Adult PT Treatment/Exercise - 01/01/20 0001       Lumbar Exercises: Aerobic   Elliptical Level 1 ramp 1 x5 mins    Nustep Level 6 x 10 minutes.      Moist Heat Therapy   Number Minutes Moist Heat 10 Minutes    Moist Heat Location Lumbar Spine      Electrical Stimulation   Electrical Stimulation Location R SIJ region    Electrical Stimulation Action pre-mod    Electrical Stimulation Parameters 80-150 hz x10 mins    Electrical Stimulation Goals Pain      Manual Therapy   Manual Therapy Soft tissue mobilization    Soft tissue mobilization STW/M to R SIJ to decrease tone an pain                       PT Long Term Goals - 01/01/20 1521      PT LONG TERM GOAL #1   Title Independent with a HEP.    Baseline met 12/27/19    Period Weeks    Status Achieved      PT LONG TERM GOAL #2   Title Perform ADL's with pain not >  3-4/10.    Baseline Able to perform basic ADLs but home activities still with pain 01/01/2020    Time 6    Period Weeks    Status On-going      PT LONG TERM GOAL #3   Title Stand  20 minutes with pain not > 3-4/10.    Time 6    Period Weeks    Status On-going                 Plan - 01/01/20 1449    Clinical Impression Statement Patient responded well to therapy session. Patient requested to try the elliptical today as she used to perform independently with good response. Patient was able to tolerate 5 mins of exercise but with fatigue. Patient still with increased tone to R SIJ and upper glute. Goals ongoing at this time. Patient and PT discussed attempting to add more exercise for core stability and strengthening. Patient reported understanding and agreement. Normal response to modalities upon removal.    Examination-Activity Limitations Other    Examination-Participation Restrictions Other    Stability/Clinical Decision Making Evolving/Moderate complexity    Clinical Decision Making Low    Rehab Potential Good    PT Frequency 2x / week    PT Duration 6 weeks    PT Treatment/Interventions  ADLs/Self Care Home Management;Cryotherapy;Ultrasound;Moist Heat;Therapeutic activities;Therapeutic exercise;Manual techniques;Patient/family education;Passive range of motion;Dry needling;Joint Manipulations;Spinal Manipulations;Electrical Stimulation    PT Next Visit Plan elliptical in place of nustep; progress core stability and strengthening; modalities PRN for pain relief    Consulted and Agree with Plan of Care Patient           Patient will benefit from skilled therapeutic intervention in order to improve the following deficits and impairments:  Pain, Decreased activity tolerance, Postural dysfunction  Visit Diagnosis: Chronic right-sided low back pain without sciatica     Problem List Patient Active Problem List   Diagnosis Date Noted  . Obesity (BMI 30-39.9) 05/24/2017  . Pregnancy complicated by Suboxone maintenance, antepartum ([place]) 10/11/2015  . Drug dependence affecting pregnancy 10/11/2015    Gabriela Eves, PT, DPT 01/01/2020, 3:26 PM  [facility] [address] [place], [place], [address] Phone: [phone]   Fax:  [phone]  Name: SCOTTLYNN LINDELL MRN: [id number] Date of Birth: [birth year]

## 2020-01-03 ENCOUNTER — Encounter: Payer: Medicaid Other | Admitting: Physical Therapy

## 2020-01-08 ENCOUNTER — Other Ambulatory Visit: Payer: Self-pay

## 2020-01-08 ENCOUNTER — Ambulatory Visit: Payer: Medicaid Other | Admitting: Physical Therapy

## 2020-01-08 DIAGNOSIS — G8929 Other chronic pain: Secondary | ICD-10-CM

## 2020-01-08 DIAGNOSIS — M545 Low back pain, unspecified: Secondary | ICD-10-CM | POA: Diagnosis not present

## 2020-01-08 NOTE — Therapy (Signed)
[facility] [place], [place], [address] Phone: [phone]   Fax:  [phone]  Physical Therapy Treatment  Patient Details  Name: Amy Noble MRN: [id number] Date of Birth: Aug 22, 1985 Referring Provider (PT): Elfredia Nevins NP.   Encounter Date: 01/08/2020   PT End of Session - 01/08/20 1342    Visit Number 11    Number of Visits 12    Date for PT Re-Evaluation 01/08/20    PT Start Time 0106    PT Stop Time 0156    PT Time Calculation (min) 50 min    Activity Tolerance Patient tolerated treatment well    Behavior During Therapy [facility] for tasks assessed/performed           Past Medical History:  Diagnosis Date  . Chronic back pain     No past surgical history on file.  There were no vitals filed for this visit.   Subjective Assessment - 01/08/20 1313    Subjective COVID-19 screen performed prior to patient entering clinic.  Pain a 6 today.    Limitations Standing    How long can you stand comfortably? varies.    Diagnostic tests MRI:    Currently in Pain? Yes    Pain Location Back    Pain Orientation Right;Lower    Pain Descriptors / Indicators Discomfort    Pain Type Chronic pain    Pain Onset More than a month ago                             [facility] Adult PT Treatment/Exercise - 01/08/20 0001      Exercises   Exercises Knee/Hip      Lumbar Exercises: Aerobic   Elliptical L4/4 x 10 minutes (5 minutes forward and 5 minutes backward).    Nustep Level 6 x 5 minutes.      Modalities   Modalities Electrical Stimulation;Moist Heat      Moist Heat Therapy   Number Minutes Moist Heat 20 Minutes    Moist Heat Location Lumbar Spine      Electrical Stimulation   Electrical Stimulation Location RT SIJ region.    Electrical Stimulation Action Pre-mod.    Electrical Stimulation Parameters 80-150 Hz x 20 minutes.    Electrical Stimulation Goals Pain      Manual Therapy   Manual Therapy Soft  tissue mobilization    Soft tissue mobilization STW/M x 8 minutes to right SIJ.                       PT Long Term Goals - 01/01/20 1521      PT LONG TERM GOAL #1   Title Independent with a HEP.    Baseline met 12/27/19    Period Weeks    Status Achieved      PT LONG TERM GOAL #2   Title Perform ADL's with pain not > 3-4/10.    Baseline Able to perform basic ADLs but home activities still with pain 01/01/2020    Time 6    Period Weeks    Status On-going      PT LONG TERM GOAL #3   Title Stand  20 minutes with pain not > 3-4/10.    Time 6    Period Weeks    Status On-going                 Plan - 01/08/20 1340  Clinical Impression Statement Pain at a 6 today.  Patient did very well with increased time on elliptical and increase ramp and resistance.  Pain mostly localized to right SIJ today.    Examination-Activity Limitations Other    Examination-Participation Restrictions Other    Stability/Clinical Decision Making Evolving/Moderate complexity    Rehab Potential Good    PT Frequency 2x / week    PT Duration 6 weeks    PT Treatment/Interventions ADLs/Self Care Home Management;Cryotherapy;Ultrasound;Moist Heat;Therapeutic activities;Therapeutic exercise;Manual techniques;Patient/family education;Passive range of motion;Dry needling;Joint Manipulations;Spinal Manipulations;Electrical Stimulation    PT Next Visit Plan elliptical in place of nustep; progress core stability and strengthening; modalities PRN for pain relief    Consulted and Agree with Plan of Care Patient           Patient will benefit from skilled therapeutic intervention in order to improve the following deficits and impairments:  Pain, Decreased activity tolerance, Postural dysfunction  Visit Diagnosis: Chronic right-sided low back pain without sciatica     Problem List Patient Active Problem List   Diagnosis Date Noted  . Obesity (BMI 30-39.9) 05/24/2017  . Pregnancy complicated  by Suboxone maintenance, antepartum ([place]) 10/11/2015  . Drug dependence affecting pregnancy 10/11/2015    Perla Echavarria, [place] MPT 01/08/2020, 2:08 PM  [facility] [address] [place], [place], [address] Phone: [phone]   Fax:  [phone]  Name: Amy Noble MRN: [id number] Date of Birth: 1985-06-12

## 2020-01-11 ENCOUNTER — Other Ambulatory Visit: Payer: Self-pay

## 2020-01-11 ENCOUNTER — Ambulatory Visit: Payer: Medicaid Other | Admitting: Physical Therapy

## 2020-01-11 DIAGNOSIS — G8929 Other chronic pain: Secondary | ICD-10-CM

## 2020-01-11 DIAGNOSIS — M545 Low back pain, unspecified: Secondary | ICD-10-CM

## 2020-01-11 NOTE — Therapy (Signed)
[facility] [place], [place], [address] Phone: [phone]   Fax:  [phone]  Physical Therapy Treatment  Patient Details  Name: Amy Noble MRN: [id number] Date of Birth: 06/30/1985 Referring Provider (PT): Elfredia Nevins NP.   Encounter Date: 01/11/2020   PT End of Session - 01/11/20 1130    Visit Number 12    Number of Visits 12    Date for PT Re-Evaluation 01/08/20    PT Start Time 1118    PT Stop Time 1200    PT Time Calculation (min) 42 min    Activity Tolerance Patient tolerated treatment well    Behavior During Therapy WFL for tasks assessed/performed           Past Medical History:  Diagnosis Date  . Chronic back pain     No past surgical history on file.  There were no vitals filed for this visit.   Subjective Assessment - 01/11/20 1129    Subjective COVID-19 screen performed prior to patient entering clinic.  Patient reported some ongoing pain.    Limitations Standing    How long can you stand comfortably? varies.    Diagnostic tests MRI:    Currently in Pain? Yes    Pain Score 5     Pain Location Back    Pain Orientation Right;Lower    Pain Descriptors / Indicators Discomfort    Pain Type Chronic pain    Pain Onset More than a month ago    Pain Frequency Constant    Aggravating Factors  prolong standing/bending    Pain Relieving Factors rest                             [facility] Adult PT Treatment/Exercise - 01/11/20 0001      Lumbar Exercises: Aerobic   Elliptical L4/4 x 10 minutes (5 minutes forward and 5 minutes backward).    Nustep Level 6 x 5 minutes.      Lumbar Exercises: Standing   Other Standing Lumbar Exercises lift and lower 14#box x 10reps floor to waist level    Other Standing Lumbar Exercises 4# step outs and chops 2x10 each      Lumbar Exercises: Supine   Bridge with clamshell 20 reps;3 seconds   green t-band     Moist Heat Therapy   Number Minutes Moist  Heat 15 Minutes    Moist Heat Location Lumbar Spine      Electrical Stimulation   Electrical Stimulation Location RT SIJ region.    Electrical Stimulation Action premod    Electrical Stimulation Parameters 80-'150hz'  x36mn    Electrical Stimulation Goals Pain                       PT Long Term Goals - 01/11/20 1131      PT LONG TERM GOAL #1   Title Independent with a HEP.    Baseline met 12/27/19    Time 6    Period Weeks    Status Achieved      PT LONG TERM GOAL #2   Title Perform ADL's with pain not > 3-4/10.    Baseline Pain level at 7-8/10 esp at work 01/11/20    Time 6    Period Weeks    Status Not Met      PT LONG TERM GOAL #3   Title Stand  20 minutes with pain not > 3-4/10.  Baseline 20 min of standing 4/10 pain 01/11/20 MET    Time 6    Period Weeks    Status Achieved                 Plan - 01/11/20 1210    Clinical Impression Statement Patient tolerated treatment well today. patient has reported overall improvement and able to stand 20 min with 4/10 pain. Patient did well with lift and lower technique and understands posture awareness techniques. Patient met all goals except pain goal with ADL's due to pain with work activities. Patient is on hold pending her MD referral.    Examination-Activity Limitations Other    Examination-Participation Restrictions Other    Stability/Clinical Decision Making Evolving/Moderate complexity    Rehab Potential Good    PT Frequency 2x / week    PT Duration 6 weeks    PT Treatment/Interventions ADLs/Self Care Home Management;Cryotherapy;Ultrasound;Moist Heat;Therapeutic activities;Therapeutic exercise;Manual techniques;Patient/family education;Passive range of motion;Dry needling;Joint Manipulations;Spinal Manipulations;Electrical Stimulation    PT Next Visit Plan on hold    Consulted and Agree with Plan of Care Patient           Patient will benefit from skilled therapeutic intervention in order to  improve the following deficits and impairments:  Pain, Decreased activity tolerance, Postural dysfunction  Visit Diagnosis: Chronic right-sided low back pain without sciatica     Problem List Patient Active Problem List   Diagnosis Date Noted  . Obesity (BMI 30-39.9) 05/24/2017  . Pregnancy complicated by Suboxone maintenance, antepartum ([place]) 10/11/2015  . Drug dependence affecting pregnancy 10/11/2015    Phillips Climes, PTA 01/11/2020, 12:14 PM  [facility] [place], [place], [address] Phone: [phone]   Fax:  [phone]  Name: Amy Noble MRN: [id number] Date of Birth: 1986-02-02

## 2020-08-08 ENCOUNTER — Other Ambulatory Visit (HOSPITAL_COMMUNITY): Payer: Self-pay | Admitting: Neurology

## 2020-08-08 ENCOUNTER — Other Ambulatory Visit: Payer: Self-pay | Admitting: Neurology

## 2020-08-08 DIAGNOSIS — M5416 Radiculopathy, lumbar region: Secondary | ICD-10-CM

## 2020-08-29 ENCOUNTER — Encounter (HOSPITAL_COMMUNITY): Payer: Self-pay

## 2020-08-29 ENCOUNTER — Ambulatory Visit (HOSPITAL_COMMUNITY): Payer: Medicaid Other

## 2020-08-30 ENCOUNTER — Other Ambulatory Visit: Payer: Medicaid Other

## 2020-09-10 ENCOUNTER — Ambulatory Visit (HOSPITAL_COMMUNITY): Payer: Medicaid Other

## 2020-09-12 ENCOUNTER — Other Ambulatory Visit: Payer: Self-pay

## 2020-09-12 ENCOUNTER — Ambulatory Visit (HOSPITAL_COMMUNITY)
Admission: RE | Admit: 2020-09-12 | Discharge: 2020-09-12 | Disposition: A | Discharge status: Home / Self Care | Payer: Medicaid Other | Source: Ambulatory Visit | Attending: Neurology | Admitting: Neurology

## 2020-09-12 DIAGNOSIS — M5416 Radiculopathy, lumbar region: Secondary | ICD-10-CM | POA: Diagnosis present

## 2020-12-03 ENCOUNTER — Emergency Department (HOSPITAL_COMMUNITY)
Admission: EM | Admit: 2020-12-03 | Discharge: 2020-12-03 | Disposition: A | Discharge status: Home / Self Care | Payer: Medicaid Other | Attending: Emergency Medicine | Admitting: Emergency Medicine

## 2020-12-03 ENCOUNTER — Encounter (HOSPITAL_COMMUNITY): Payer: Self-pay | Admitting: *Deleted

## 2020-12-03 ENCOUNTER — Other Ambulatory Visit: Payer: Self-pay

## 2020-12-03 DIAGNOSIS — M545 Low back pain, unspecified: Secondary | ICD-10-CM | POA: Diagnosis not present

## 2020-12-03 MED ORDER — DICLOFENAC SODIUM 75 MG PO TBEC: 75.0000 mg | DELAYED_RELEASE_TABLET | Freq: Two times a day (BID) | ORAL | 0 refills | Status: DC

## 2020-12-03 MED ORDER — LIDOCAINE 5 % EX PTCH: 1.0000 | MEDICATED_PATCH | CUTANEOUS | 0 refills | Status: DC

## 2020-12-03 MED ORDER — KETOROLAC TROMETHAMINE 60 MG/2ML IM SOLN
60.0000 mg | Freq: Once | INTRAMUSCULAR | Status: AC
  Administered 2020-12-03: 60 mg via INTRAMUSCULAR
  Filled 2020-12-03: qty 2

## 2020-12-03 NOTE — ED Triage Notes (Signed)
Lower right sided back pain for 2 weeks
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
## 2020-12-03 NOTE — Discharge Instructions (Signed)
Apply the Lidoderm patches as directed.  Also, stop taking the ibuprofen while taking the medication you have been prescribed today.  Please call your neurologist to arrange a follow-up appointment.  Return to the emergency department for any new or worsening symptoms.

## 2021-01-25 ENCOUNTER — Other Ambulatory Visit: Payer: Self-pay

## 2021-01-25 ENCOUNTER — Emergency Department (HOSPITAL_BASED_OUTPATIENT_CLINIC_OR_DEPARTMENT_OTHER)
Admission: EM | Admit: 2021-01-25 | Discharge: 2021-01-26 | Disposition: A | Discharge status: Home / Self Care | Payer: Medicaid Other | Attending: Emergency Medicine | Admitting: Emergency Medicine

## 2021-01-25 ENCOUNTER — Encounter (HOSPITAL_BASED_OUTPATIENT_CLINIC_OR_DEPARTMENT_OTHER): Payer: Self-pay | Admitting: *Deleted

## 2021-01-25 DIAGNOSIS — S0501XA Injury of conjunctiva and corneal abrasion without foreign body, right eye, initial encounter: Secondary | ICD-10-CM | POA: Diagnosis not present

## 2021-01-25 DIAGNOSIS — S0591XA Unspecified injury of right eye and orbit, initial encounter: Secondary | ICD-10-CM | POA: Diagnosis present

## 2021-01-25 DIAGNOSIS — X58XXXA Exposure to other specified factors, initial encounter: Secondary | ICD-10-CM | POA: Insufficient documentation

## 2021-01-25 MED ORDER — TETRACAINE HCL 0.5 % OP SOLN
2.0000 [drp] | Freq: Once | OPHTHALMIC | Status: AC
  Administered 2021-01-25: 2 [drp] via OPHTHALMIC
  Filled 2021-01-25: qty 4

## 2021-01-25 MED ORDER — FLUORESCEIN SODIUM 1 MG OP STRP
1.0000 | ORAL_STRIP | Freq: Once | OPHTHALMIC | Status: AC
  Administered 2021-01-25: 1 via OPHTHALMIC
  Filled 2021-01-25: qty 1

## 2021-01-25 MED ORDER — ERYTHROMYCIN 5 MG/GM OP OINT: TOPICAL_OINTMENT | OPHTHALMIC | 0 refills | Status: DC

## 2021-01-25 NOTE — ED Provider Notes (Signed)
[facility] EMERGENCY DEPARTMENT Provider Note   CSN: [id number] Arrival date & time: 01/25/21  2159     History Chief Complaint  Patient presents with   Eye Pain    Amy Noble is a 35 y.o. female.  HPI     35 year old female with history of chronic back pain presents with concern for right eye injury after accidentally putting acrylic nail glue in her eye instead of eyedrops.  Reports that she reached into her purse and pulled out a bottle which she thought was her eyedrops, and put one of the drops into her eye and immediately felt burning and realized it was the wrong dropper.  Reports it was acrylic nail glue from [employer].  She immediately washed out her eyes for approximately 10 minutes in the sink.  Reports she still has some foreign body sensation in the right eye.  Feels some subjective blurriness especially when she turns her eyes different directions.  Reports the pain is about 8 out of 10 at this time.  No other acute concerns. Past Medical History:  Diagnosis Date   Chronic back pain     Patient Active Problem List   Diagnosis Date Noted   Obesity (BMI 30-39.9) 05/24/2017   Pregnancy complicated by Suboxone maintenance, antepartum ([place]) 10/11/2015   Drug dependence affecting pregnancy 10/11/2015    History reviewed. No pertinent surgical history.   OB History   No obstetric history on file.     Family History  Problem Relation Age of Onset   Cancer Mother     Social History   Tobacco Use   Smoking status: Never   Smokeless tobacco: Never  Vaping Use   Vaping Use: Never used  Substance Use Topics   Alcohol use: Yes    Comment: occ   Drug use: Not Currently    Types: Oxycodone, Morphine, Opium, Marijuana    Home Medications Prior to Admission medications   Medication Sig Start Date End Date Taking? Authorizing Provider  baclofen (LIORESAL) 10 MG tablet Take 1 tablet (10 mg total) by mouth 3 (three) times daily. Patient not  taking: Reported on 07/27/2017 05/24/17   Sharion Balloon, FNP  Buprenorphine HCl-Naloxone HCl (SUBOXONE SL) Place under the tongue. Patient says she was getting from a friend not a provider.    [provider]  cetirizine (ZYRTEC) 10 MG tablet Take 1 tablet (10 mg total) by mouth daily. 05/22/15   Gloriann Loan, PA-C  diclofenac (VOLTAREN) 75 MG EC tablet Take 1 tablet (75 mg total) by mouth 2 (two) times daily. Take with food 12/03/20   Triplett, Tammy, PA-C  erythromycin ophthalmic ointment Place a 1/2 inch ribbon of ointment into the lower eyelid four times daily for one week. 01/26/21   Gareth Morgan, MD  gabapentin (NEURONTIN) 100 MG capsule Take 1 capsule (100 mg total) by mouth 3 (three) times daily. 05/24/17   Evelina Dun A, FNP  lidocaine (LIDODERM) 5 % Place 1 patch onto the skin daily. Remove & Discard patch within 12 hours or as directed by MD 12/03/20   Triplett, Tammy, PA-C  ondansetron (ZOFRAN) 4 MG tablet Take 1 tablet (4 mg total) by mouth every 6 (six) hours. 07/02/17   Ashley Murrain, NP  promethazine-phenylephrine (PROMETHAZINE VC) 6.25-5 MG/5ML SYRP Take 5 mLs by mouth every 4 (four) hours as needed for congestion. 07/02/17   Ashley Murrain, NP    Allergies    Penicillins  Review of Systems   Review  of Systems  Constitutional:  Negative for fever.  Eyes:  Positive for pain, discharge (tearing), redness and visual disturbance.   Physical Exam Updated Vital Signs BP 111/83   Pulse 61   Temp 98.5 F (36.9 C) (Oral)   Resp 14   Ht 5\' 3"  (1.6 m)   Wt 95.3 kg   SpO2 99%   BMI 37.20 kg/m   Physical Exam Vitals and nursing note reviewed.  Constitutional:      General: She is not in acute distress.    Appearance: Normal appearance. She is not ill-appearing, toxic-appearing or diaphoretic.  HENT:     Head: Normocephalic.  Eyes:     Extraocular Movements: Extraocular movements intact.     Conjunctiva/sclera:     Right eye: Right conjunctiva is injected. No  exudate.    Comments: Fluorescein staining with uptake right to the pupil, nothing overlying pupil, no sign of residual adhesive  Small amount of glue on lashes   Cardiovascular:     Rate and Rhythm: Normal rate and regular rhythm.     Pulses: Normal pulses.  Pulmonary:     Effort: Pulmonary effort is normal. No respiratory distress.  Musculoskeletal:        General: No deformity or signs of injury.     Cervical back: No rigidity.  Skin:    General: Skin is warm and dry.     Coloration: Skin is not jaundiced or pale.  Neurological:     General: No focal deficit present.     Mental Status: She is alert and oriented to person, place, and time.    ED Results / Procedures / Treatments   Labs (all labs ordered are listed, but only abnormal results are displayed) Labs Reviewed - No data to display  EKG None  Radiology No results found.  Procedures Procedures   Medications Ordered in ED Medications  tetracaine (PONTOCAINE) 0.5 % ophthalmic solution 2 drop (2 drops Right Eye Given 01/25/21 2311)  fluorescein ophthalmic strip 1 strip (1 strip Right Eye Given 01/25/21 2312)    ED Course  I have reviewed the triage vital signs and the nursing notes.  Pertinent labs & imaging results that were available during my care of the patient were reviewed by me and considered in my medical decision making (see chart for details).    MDM Rules/Calculators/A&P                             35 year old female with history of chronic back pain presents with concern for right eye injury after accidentally putting acrylic nail glue in her eye instead of eyedrops.  Contact present in trouble.  Irrigated eyes for 15 minutes.  Reports significant improvement of her symptoms following irrigation.  Eyes were stained with some area of uptake.  Do not see signs of continued adhesive in the eye, suspect corneal abrasion related to the adhesive. Unclear etiology of mild visual change on acuity,  possible tearing given no abrasion overlying pupil. Given prescription for the erythromycin 4 times daily and follow-up with ophthalmology. Patient discharged in stable condition with understanding of reasons to return.     Final Clinical Impression(s) / ED Diagnoses Final diagnoses:  Abrasion of right cornea, initial encounter  Right eye injury, initial encounter    Rx / DC Orders ED Discharge Orders          Ordered    erythromycin ophthalmic ointment  01/26/21 0004    erythromycin ophthalmic ointment  Status:  Discontinued        01/25/21 2344             Alvira Monday, MD 01/26/21 1904

## 2021-01-25 NOTE — ED Triage Notes (Signed)
Pt inadvertently put nail glue in her right eye instead of eye drop approx 2 hours ago

## 2021-01-26 MED ORDER — ERYTHROMYCIN 5 MG/GM OP OINT: TOPICAL_OINTMENT | OPHTHALMIC | 0 refills | Status: DC

## 2021-01-26 NOTE — ED Notes (Signed)
Discharge instructions discussed with pt. Pt verbalized understanding with no questions at this time.  

## 2021-01-29 ENCOUNTER — Other Ambulatory Visit: Payer: Self-pay | Admitting: Orthopedic Surgery

## 2021-02-18 ENCOUNTER — Encounter (HOSPITAL_BASED_OUTPATIENT_CLINIC_OR_DEPARTMENT_OTHER): Payer: Self-pay | Admitting: Orthopedic Surgery

## 2021-02-18 ENCOUNTER — Other Ambulatory Visit: Payer: Self-pay

## 2021-02-28 NOTE — Progress Notes (Signed)
Sent text reminding pt to come pick up ERAS drink.

## 2021-02-28 NOTE — Progress Notes (Signed)

## 2021-03-03 ENCOUNTER — Encounter (HOSPITAL_BASED_OUTPATIENT_CLINIC_OR_DEPARTMENT_OTHER)
Admission: RE | Disposition: A | Discharge status: Home / Self Care | Payer: Self-pay | Source: Home / Self Care | Attending: Orthopedic Surgery

## 2021-03-03 ENCOUNTER — Ambulatory Visit (HOSPITAL_BASED_OUTPATIENT_CLINIC_OR_DEPARTMENT_OTHER): Payer: Medicaid Other | Admitting: Anesthesiology

## 2021-03-03 ENCOUNTER — Encounter (HOSPITAL_BASED_OUTPATIENT_CLINIC_OR_DEPARTMENT_OTHER): Payer: Self-pay | Admitting: Orthopedic Surgery

## 2021-03-03 ENCOUNTER — Other Ambulatory Visit: Payer: Self-pay

## 2021-03-03 ENCOUNTER — Ambulatory Visit (HOSPITAL_BASED_OUTPATIENT_CLINIC_OR_DEPARTMENT_OTHER)
Admission: RE | Admit: 2021-03-03 | Discharge: 2021-03-03 | Disposition: A | Discharge status: Home / Self Care | Payer: Medicaid Other | Attending: Orthopedic Surgery | Admitting: Orthopedic Surgery

## 2021-03-03 DIAGNOSIS — E039 Hypothyroidism, unspecified: Secondary | ICD-10-CM | POA: Insufficient documentation

## 2021-03-03 DIAGNOSIS — M67432 Ganglion, left wrist: Secondary | ICD-10-CM | POA: Insufficient documentation

## 2021-03-03 HISTORY — DX: Headache, unspecified: R51.9

## 2021-03-03 HISTORY — PX: GANGLION CYST EXCISION: SHX1691

## 2021-03-03 HISTORY — DX: Hypothyroidism, unspecified: E03.9

## 2021-03-03 LAB — POCT PREGNANCY, URINE: Preg Test, Ur: NEGATIVE

## 2021-03-03 SURGERY — EXCISION, GANGLION CYST, WRIST
Anesthesia: General | Site: Wrist | Laterality: Left

## 2021-03-03 MED ORDER — HYDROCODONE-ACETAMINOPHEN 5-325 MG PO TABS: ORAL_TABLET | ORAL | 0 refills | Status: DC

## 2021-03-03 MED ORDER — FENTANYL CITRATE (PF) 100 MCG/2ML IJ SOLN
INTRAMUSCULAR | Status: DC | PRN
  Administered 2021-03-03 (×2): 50 ug via INTRAVENOUS

## 2021-03-03 MED ORDER — DEXAMETHASONE SODIUM PHOSPHATE 10 MG/ML IJ SOLN
INTRAMUSCULAR | Status: DC | PRN
  Administered 2021-03-03: 4 mg via INTRAVENOUS

## 2021-03-03 MED ORDER — MIDAZOLAM HCL 2 MG/2ML IJ SOLN
INTRAMUSCULAR | Status: AC
  Filled 2021-03-03: qty 2

## 2021-03-03 MED ORDER — HYDROMORPHONE HCL 1 MG/ML IJ SOLN: 0.2500 mg | INTRAMUSCULAR | Status: DC | PRN

## 2021-03-03 MED ORDER — LIDOCAINE 2% (20 MG/ML) 5 ML SYRINGE
INTRAMUSCULAR | Status: DC | PRN
  Administered 2021-03-03: 60 mg via INTRAVENOUS

## 2021-03-03 MED ORDER — PROPOFOL 10 MG/ML IV BOLUS
INTRAVENOUS | Status: DC | PRN
  Administered 2021-03-03: 50 mg via INTRAVENOUS
  Administered 2021-03-03: 200 mg via INTRAVENOUS

## 2021-03-03 MED ORDER — LACTATED RINGERS IV SOLN: INTRAVENOUS | Status: DC

## 2021-03-03 MED ORDER — AMISULPRIDE (ANTIEMETIC) 5 MG/2ML IV SOLN: 10.0000 mg | Freq: Once | INTRAVENOUS | Status: DC | PRN

## 2021-03-03 MED ORDER — OXYCODONE HCL 5 MG/5ML PO SOLN: 5.0000 mg | Freq: Once | ORAL | Status: DC | PRN

## 2021-03-03 MED ORDER — DEXAMETHASONE SODIUM PHOSPHATE 10 MG/ML IJ SOLN
INTRAMUSCULAR | Status: AC
  Filled 2021-03-03: qty 1

## 2021-03-03 MED ORDER — PROMETHAZINE HCL 25 MG/ML IJ SOLN: 6.2500 mg | INTRAMUSCULAR | Status: DC | PRN

## 2021-03-03 MED ORDER — LIDOCAINE 2% (20 MG/ML) 5 ML SYRINGE
INTRAMUSCULAR | Status: AC
  Filled 2021-03-03: qty 5

## 2021-03-03 MED ORDER — FENTANYL CITRATE (PF) 100 MCG/2ML IJ SOLN
INTRAMUSCULAR | Status: AC
  Filled 2021-03-03: qty 2

## 2021-03-03 MED ORDER — MIDAZOLAM HCL 5 MG/5ML IJ SOLN
INTRAMUSCULAR | Status: DC | PRN
  Administered 2021-03-03: 2 mg via INTRAVENOUS

## 2021-03-03 MED ORDER — CLINDAMYCIN PHOSPHATE 900 MG/50ML IV SOLN
INTRAVENOUS | Status: AC
  Filled 2021-03-03: qty 50

## 2021-03-03 MED ORDER — ONDANSETRON HCL 4 MG/2ML IJ SOLN
INTRAMUSCULAR | Status: AC
  Filled 2021-03-03: qty 2

## 2021-03-03 MED ORDER — 0.9 % SODIUM CHLORIDE (POUR BTL) OPTIME
TOPICAL | Status: DC | PRN
  Administered 2021-03-03: 75 mL

## 2021-03-03 MED ORDER — CLINDAMYCIN PHOSPHATE 900 MG/50ML IV SOLN
900.0000 mg | INTRAVENOUS | Status: AC
  Administered 2021-03-03: 900 mg via INTRAVENOUS

## 2021-03-03 MED ORDER — OXYCODONE HCL 5 MG PO TABS: 5.0000 mg | ORAL_TABLET | Freq: Once | ORAL | Status: DC | PRN

## 2021-03-03 MED ORDER — BUPIVACAINE HCL (PF) 0.25 % IJ SOLN
INTRAMUSCULAR | Status: DC | PRN
  Administered 2021-03-03: 7.5 mL

## 2021-03-03 MED ORDER — ONDANSETRON HCL 4 MG/2ML IJ SOLN
INTRAMUSCULAR | Status: DC | PRN
  Administered 2021-03-03: 4 mg via INTRAVENOUS

## 2021-03-03 MED ORDER — MEPERIDINE HCL 25 MG/ML IJ SOLN: 6.2500 mg | INTRAMUSCULAR | Status: DC | PRN

## 2021-03-03 SURGICAL SUPPLY — 49 items
APL PRP STRL LF DISP 70% ISPRP (MISCELLANEOUS) ×1
APL SKNCLS STERI-STRIP NONHPOA (GAUZE/BANDAGES/DRESSINGS) ×1
BENZOIN TINCTURE PRP APPL 2/3 (GAUZE/BANDAGES/DRESSINGS) ×1 IMPLANT
BLADE MINI RND TIP GREEN BEAV (BLADE) IMPLANT
BLADE SURG 15 STRL LF DISP TIS (BLADE) ×2 IMPLANT
BLADE SURG 15 STRL SS (BLADE) ×4
BNDG CMPR 9X4 STRL LF SNTH (GAUZE/BANDAGES/DRESSINGS) ×1
BNDG ELASTIC 2X5.8 VLCR STR LF (GAUZE/BANDAGES/DRESSINGS) IMPLANT
BNDG ELASTIC 3X5.8 VLCR STR LF (GAUZE/BANDAGES/DRESSINGS) ×2 IMPLANT
BNDG ESMARK 4X9 LF (GAUZE/BANDAGES/DRESSINGS) ×1 IMPLANT
BNDG GAUZE ELAST 4 BULKY (GAUZE/BANDAGES/DRESSINGS) ×2 IMPLANT
CHLORAPREP W/TINT 26 (MISCELLANEOUS) ×2 IMPLANT
CORD BIPOLAR FORCEPS 12FT (ELECTRODE) ×2 IMPLANT
COVER BACK TABLE 60X90IN (DRAPES) ×2 IMPLANT
COVER MAYO STAND STRL (DRAPES) ×2 IMPLANT
CUFF TOURN SGL QUICK 18X4 (TOURNIQUET CUFF) ×2 IMPLANT
DRAPE EXTREMITY T 121X128X90 (DISPOSABLE) ×2 IMPLANT
DRAPE SURG 17X23 STRL (DRAPES) ×1 IMPLANT
DRAPE U-SHAPE 47X51 STRL (DRAPES) ×1 IMPLANT
DRSG PAD ABDOMINAL 8X10 ST (GAUZE/BANDAGES/DRESSINGS) ×1 IMPLANT
GAUZE SPONGE 4X4 12PLY STRL (GAUZE/BANDAGES/DRESSINGS) ×2 IMPLANT
GAUZE XEROFORM 1X8 LF (GAUZE/BANDAGES/DRESSINGS) ×2 IMPLANT
GLOVE SRG 8 PF TXTR STRL LF DI (GLOVE) ×1 IMPLANT
GLOVE SURG ENC MOIS LTX SZ7.5 (GLOVE) ×2 IMPLANT
GLOVE SURG POLYISO LF SZ7 (GLOVE) ×1 IMPLANT
GLOVE SURG UNDER POLY LF SZ7 (GLOVE) ×2 IMPLANT
GLOVE SURG UNDER POLY LF SZ8 (GLOVE) ×2
GOWN STRL REUS W/ TWL LRG LVL3 (GOWN DISPOSABLE) ×1 IMPLANT
GOWN STRL REUS W/TWL LRG LVL3 (GOWN DISPOSABLE) ×2
GOWN STRL REUS W/TWL XL LVL3 (GOWN DISPOSABLE) ×2 IMPLANT
NDL HYPO 25X1 1.5 SAFETY (NEEDLE) IMPLANT
NEEDLE HYPO 25X1 1.5 SAFETY (NEEDLE) ×2 IMPLANT
NS IRRIG 1000ML POUR BTL (IV SOLUTION) ×2 IMPLANT
PACK BASIN DAY SURGERY FS (CUSTOM PROCEDURE TRAY) ×2 IMPLANT
PAD CAST 3X4 CTTN HI CHSV (CAST SUPPLIES) IMPLANT
PADDING CAST ABS 4INX4YD NS (CAST SUPPLIES)
PADDING CAST ABS COTTON 4X4 ST (CAST SUPPLIES) ×1 IMPLANT
PADDING CAST COTTON 3X4 STRL (CAST SUPPLIES)
SPLINT PLASTER CAST XFAST 3X15 (CAST SUPPLIES) IMPLANT
SPLINT PLASTER XTRA FASTSET 3X (CAST SUPPLIES)
STOCKINETTE 4X48 STRL (DRAPES) ×2 IMPLANT
STRIP CLOSURE SKIN 1/2X4 (GAUZE/BANDAGES/DRESSINGS) ×1 IMPLANT
SUT MNCRL AB 4-0 PS2 18 (SUTURE) ×1 IMPLANT
SUT MON AB 5-0 PS2 18 (SUTURE) IMPLANT
SUT VIC AB 4-0 P2 18 (SUTURE) ×1 IMPLANT
SYR BULB EAR ULCER 3OZ GRN STR (SYRINGE) ×2 IMPLANT
SYR CONTROL 10ML LL (SYRINGE) ×1 IMPLANT
TOWEL GREEN STERILE FF (TOWEL DISPOSABLE) ×4 IMPLANT
UNDERPAD 30X36 HEAVY ABSORB (UNDERPADS AND DIAPERS) ×2 IMPLANT

## 2021-03-03 NOTE — Anesthesia Postprocedure Evaluation (Signed)
Anesthesia Post Note  Patient: Amy Noble  Procedure(s) Performed: EXCISION MASS DORSUM LEFT WRIST (Left: Wrist)     Patient location during evaluation: PACU Anesthesia Type: General Level of consciousness: awake and alert Pain management: pain level controlled Vital Signs Assessment: post-procedure vital signs reviewed and stable Respiratory status: spontaneous breathing, nonlabored ventilation and respiratory function stable Cardiovascular status: blood pressure returned to baseline and stable Postop Assessment: no apparent nausea or vomiting Anesthetic complications: no   No notable events documented.  Last Vitals:  Vitals:   03/03/21 1430 03/03/21 1456  BP: 129/84 126/82  Pulse: 69 60  Resp: 12 18  Temp: 36.6 C 36.6 C  SpO2: 98% 95%    Last Pain:  Vitals:   03/03/21 1456  TempSrc:   PainSc: 0-No pain                 Lowella Curb

## 2021-03-03 NOTE — Transfer of Care (Signed)
Immediate Anesthesia Transfer of Care Note  Patient: Amy Noble  Procedure(s) Performed: EXCISION MASS DORSUM LEFT WRIST (Left: Wrist)  Patient Location: PACU  Anesthesia Type:General  Level of Consciousness: drowsy  Airway & Oxygen Therapy: Patient Spontanous Breathing and Patient connected to face mask oxygen  Post-op Assessment: Report given to RN and Post -op Vital signs reviewed and stable  Post vital signs: Reviewed and stable  Last Vitals:  Vitals Value Taken Time  BP 117/72 03/03/21 1404  Temp    Pulse 68 03/03/21 1404  Resp 12 03/03/21 1404  SpO2 100 % 03/03/21 1404  Vitals shown include unvalidated device data.  Last Pain:  Vitals:   03/03/21 1207  TempSrc: Oral  PainSc: 0-No pain         Complications: No notable events documented.

## 2021-03-03 NOTE — Anesthesia Procedure Notes (Signed)
Procedure Name: LMA Insertion Date/Time: 03/03/2021 1:26 PM Performed by: Eulas Post, Lysle Yero W, CRNA Pre-anesthesia Checklist: Patient identified, Emergency Drugs available, Suction available and Patient being monitored Patient Re-evaluated:Patient Re-evaluated prior to induction Oxygen Delivery Method: Circle system utilized Preoxygenation: Pre-oxygenation with 100% oxygen Induction Type: IV induction Ventilation: Mask ventilation without difficulty LMA: LMA inserted LMA Size: 4.0 Number of attempts: 1 Placement Confirmation: positive ETCO2 and breath sounds checked- equal and bilateral Tube secured with: Tape Dental Injury: Teeth and Oropharynx as per pre-operative assessment

## 2021-03-03 NOTE — Discharge Instructions (Addendum)

## 2021-03-03 NOTE — Anesthesia Preprocedure Evaluation (Signed)
Anesthesia Evaluation  Patient identified by MRN, date of birth, ID band Patient awake    Reviewed: Allergy & Precautions, NPO status , Patient's Chart, lab work & pertinent test results  Airway Mallampati: II  TM Distance: >3 FB Neck ROM: Full    Dental no notable dental hx.    Pulmonary neg pulmonary ROS,    Pulmonary exam normal breath sounds clear to auscultation       Cardiovascular negative cardio ROS Normal cardiovascular exam Rhythm:Regular Rate:Normal     Neuro/Psych  Headaches, negative psych ROS   GI/Hepatic negative GI ROS, Neg liver ROS,   Endo/Other  Hypothyroidism   Renal/GU negative Renal ROS  negative genitourinary   Musculoskeletal negative musculoskeletal ROS (+)   Abdominal (+) + obese,   Peds negative pediatric ROS (+)  Hematology negative hematology ROS (+)   Anesthesia Other Findings   Reproductive/Obstetrics negative OB ROS                             Anesthesia Physical Anesthesia Plan  ASA: 2  Anesthesia Plan: General   Post-op Pain Management:    Induction: Intravenous  PONV Risk Score and Plan: 3 and Ondansetron, Dexamethasone and Midazolam  Airway Management Planned: LMA  Additional Equipment:   Intra-op Plan:   Post-operative Plan: Extubation in OR  Informed Consent: I have reviewed the patients History and Physical, chart, labs and discussed the procedure including the risks, benefits and alternatives for the proposed anesthesia with the patient or authorized representative who has indicated his/her understanding and acceptance.     Dental advisory given  Plan Discussed with: CRNA  Anesthesia Plan Comments:         Anesthesia Quick Evaluation

## 2021-03-03 NOTE — Op Note (Signed)
NAME: Amy Noble MEDICAL RECORD NO: [id number] DATE OF BIRTH: [birth year] FACILITY: [facility] LOCATION: [facility] PHYSICIAN: Tami Ribas, MD   OPERATIVE REPORT   DATE OF PROCEDURE: 03/03/21    PREOPERATIVE DIAGNOSIS: Left wrist dorsal ganglion cyst   POSTOPERATIVE DIAGNOSIS: Left wrist dorsal ganglion cyst   PROCEDURE: Excision of left wrist dorsal ganglion cyst   SURGEON:  Betha Loa, M.D.   ASSISTANT: none   ANESTHESIA:  General   INTRAVENOUS FLUIDS:  Per anesthesia flow sheet.   ESTIMATED BLOOD LOSS:  Minimal.   COMPLICATIONS:  None.   SPECIMENS: Left wrist cyst to pathology   TOURNIQUET TIME:    Total Tourniquet Time Documented: Upper Arm (Left) - 24 minutes Total: Upper Arm (Left) - 24 minutes    DISPOSITION:  Stable to PACU.   INDICATIONS: 36 year old female with a mass on the dorsum of her left wrist.  It is bothersome to her.  She wishes to have it removed.  Risks, benefits and alternatives of surgery were discussed including the risks of blood loss, infection, damage to nerves, vessels, tendons, ligaments, bone for surgery, need for additional surgery, complications with wound healing, continued pain, stiffness, , recurrence.  She voiced understanding of these risks and elected to proceed.  OPERATIVE COURSE:  After being identified preoperatively by myself,  the patient and I agreed on the procedure and site of the procedure.  The surgical site was marked.  Surgical consent had been signed. She was given IV antibiotics as preoperative antibiotic prophylaxis. She was transferred to the operating room and placed on the operating table in supine position with the Left upper extremity on an arm board.  General anesthesia was induced by the anesthesiologist.  Left upper extremity was prepped and draped in normal sterile orthopedic fashion.  A surgical pause was performed between the surgeons, anesthesia, and operating room staff and all were in  agreement as to the patient, procedure, and site of procedure.  Tourniquet at the proximal aspect of the extremity was inflated to 250 mmHg after exsanguination of the arm with an Esmarch bandage.  Transverse incision was made over the mass on the dorsum of the left wrist.  This was carried into the subcutaneous tissues by spreading technique.  Bipolar electrocautery is used to obtain hemostasis.  The cyst was easily identified.  Was cleared of soft tissue attachments.  It was filled with clear gelatinous fluid.  A stalk was found coming from the radiocarpal joint.  The and the cyst was removed and sent to pathology for examination.  Rongeurs were used to debride the source of the cyst.  The rent in the capsule was repaired with 4-0 Vicryl suture in a figure-of-eight fashion.  The wound was copiously irrigated with sterile saline by bulb syringe.  Inverted interrupted Vicryl sutures were placed in subcutaneous tissues and skin was closed with a 4-0 running subcuticular Monocryl stitch.  This was augmented with benzoin and Steri-Strips.  The wound was injected with quarter percent plain Marcaine to aid in postoperative analgesia.  It was then dressed with sterile 4 x 4's and ABD used as a splint.  This was wrapped with Kerlix and Ace bandage.  The tourniquet was deflated at 24 minutes.  Fingertips were pink with brisk capillary refill after deflation of tourniquet.  The operative  drapes were broken down.  The patient was awoken from anesthesia safely.  She was transferred back to the stretcher and taken to PACU in stable condition.  I  will see her back in the office in 1 week for postoperative followup.  I will give her a prescription for Norco 5/325 1-2 tabs PO q6 hours prn pain, dispense # 15.   Betha Loa, MD Electronically signed, 03/03/21

## 2021-03-03 NOTE — H&P (Signed)
°  Amy Noble is an 36 y.o. female.   Chief Complaint: ganglion HPI: 36 yo female with mass dorsum left wrist.  It is bothersome to her.  She wishes to have it removed.  Allergies:  Allergies  Allergen Reactions   Penicillins Rash    Past Medical History:  Diagnosis Date   Chronic back pain    Headache    Hypothyroidism     History reviewed. No pertinent surgical history.  Family History: Family History  Problem Relation Age of Onset   Cancer Mother     Social History:   reports that she has never smoked. She has never used smokeless tobacco. She reports current alcohol use. She reports that she does not currently use drugs after having used the following drugs: Oxycodone, Morphine, Opium, and Marijuana.  Medications: Medications Prior to Admission  Medication Sig Dispense Refill   baclofen (LIORESAL) 10 MG tablet Take 1 tablet (10 mg total) by mouth 3 (three) times daily. 30 each 0   Buprenorphine ER (SUBLOCADE) 100 MG/0.5ML SOSY Inject into the skin. Once a month     levothyroxine (SYNTHROID) 25 MCG tablet Take 25 mcg by mouth daily before breakfast.     Buprenorphine HCl-Naloxone HCl (SUBOXONE SL) Place under the tongue. Patient says she was getting from a friend not a provider.     lidocaine (LIDODERM) 5 % Place 1 patch onto the skin daily. Remove & Discard patch within 12 hours or as directed by MD 30 patch 0    No results found for this or any previous visit (from the past 48 hour(s)).  No results found.    Height 5\' 3"  (1.6 m), weight 95.3 kg.  General appearance: alert, cooperative, and appears stated age Head: Normocephalic, without obvious abnormality, atraumatic Neck: supple, symmetrical, trachea midline Cardio: regular rate and rhythm Resp: clear to auscultation bilaterally Extremities: Intact sensation and capillary refill all digits.  +epl/fpl/io.  No wounds.  Pulses: 2+ and symmetric Skin: Skin color, texture, turgor normal. No rashes or  lesions Neurologic: Grossly normal Incision/Wound: none  Assessment/Plan Left wrist ganglion cyst.  Non operative and operative treatment options have been discussed with the patient and patient wishes to proceed with operative treatment. Risks, benefits, and alternatives of surgery have been discussed and the patient agrees with the plan of care.   03/03/2021, 11:51 AM

## 2021-03-04 ENCOUNTER — Encounter (HOSPITAL_BASED_OUTPATIENT_CLINIC_OR_DEPARTMENT_OTHER): Payer: Self-pay | Admitting: Orthopedic Surgery

## 2021-03-05 LAB — SURGICAL PATHOLOGY

## 2021-06-05 ENCOUNTER — Encounter: Payer: Self-pay | Admitting: Podiatry

## 2021-06-05 ENCOUNTER — Ambulatory Visit: Payer: Medicaid Other | Admitting: Podiatry

## 2021-06-05 ENCOUNTER — Ambulatory Visit (INDEPENDENT_AMBULATORY_CARE_PROVIDER_SITE_OTHER): Payer: Medicaid Other

## 2021-06-05 DIAGNOSIS — M7750 Other enthesopathy of unspecified foot: Secondary | ICD-10-CM | POA: Diagnosis not present

## 2021-06-05 DIAGNOSIS — M7752 Other enthesopathy of left foot: Secondary | ICD-10-CM | POA: Diagnosis not present

## 2021-06-05 DIAGNOSIS — M722 Plantar fascial fibromatosis: Secondary | ICD-10-CM | POA: Diagnosis not present

## 2021-06-05 MED ORDER — MELOXICAM 15 MG PO TABS: 15.0000 mg | ORAL_TABLET | Freq: Every day | ORAL | 3 refills | Status: DC

## 2021-06-05 NOTE — Patient Instructions (Signed)

## 2021-06-05 NOTE — Progress Notes (Signed)
?  Subjective:  ?Patient ID: Amy Noble, female    DOB: [birth year],  MRN: [id number] ? ?Chief Complaint  ?Patient presents with  ? Foot Pain  ?  (np) Pain of plantar area of left foot  ? ? ?36 y.o. female presents with the above complaint. History confirmed with patient.  This is been going on for about 3 months.  She is been trying massage it and ice it. ? ?Objective:  ?Physical Exam: ?warm, good capillary refill, no trophic changes or ulcerative lesions, normal DP and PT pulses, and normal sensory exam. ?Left Foot: point tenderness over the heel pad ? ?Radiographs: ?Multiple views x-ray of the left foot: no fracture, dislocation, swelling or degenerative changes noted, plantar calcaneal spur, and posterior calcaneal spur ?Assessment:  ? ?1. Plantar fasciitis of left foot   ? ? ? ?Plan:  ?Patient was evaluated and treated and all questions answered. ? ?Discussed the etiology and treatment options for plantar fasciitis including stretching, formal physical therapy, supportive shoegears such as a running shoe or sneaker, pre fabricated orthoses, injection therapy, and oral medications. We also discussed the role of surgical treatment of this for patients who do not improve after exhausting non-surgical treatment options. ? ? ?-XR reviewed with patient ?-Educated patient on stretching and icing of the affected limb ?-Injection delivered to the plantar fascia of the left foot. ?-Rx for meloxicam. Educated on use, risks and benefits of the medication ? ?After sterile prep with povidone-iodine solution and alcohol, the left heel was injected with 0.5cc 2% xylocaine plain, 0.5cc 0.5% marcaine plain, 5mg  triamcinolone acetonide, and 2mg  dexamethasone was injected along the medial plantar fascia at the insertion on the plantar calcaneus. The patient tolerated the procedure well without complication. ? ?Return in about 1 month (around 07/05/2021) for recheck plantar fasciitis.  ? ?

## 2021-07-08 ENCOUNTER — Ambulatory Visit: Payer: Medicaid Other | Admitting: Podiatry

## 2021-09-10 ENCOUNTER — Other Ambulatory Visit: Payer: Self-pay

## 2021-09-10 ENCOUNTER — Ambulatory Visit: Payer: Medicaid Other | Attending: Orthopedic Surgery | Admitting: Physical Therapy

## 2021-09-10 ENCOUNTER — Encounter: Payer: Self-pay | Admitting: Physical Therapy

## 2021-09-10 DIAGNOSIS — M25562 Pain in left knee: Secondary | ICD-10-CM | POA: Insufficient documentation

## 2021-09-10 NOTE — Therapy (Signed)
OUTPATIENT PHYSICAL THERAPY LOWER EXTREMITY EVALUATION   Patient Name: Amy Noble MRN: [id number] DOB:[birth year], 36 y.o., female Today's Date: 09/10/2021   PT End of Session - 09/10/21 1326     Visit Number 1    Number of Visits 10    Date for PT Re-Evaluation 10/15/21    PT Start Time 0101    PT Stop Time 0125    PT Time Calculation (min) 24 min    Activity Tolerance Patient tolerated treatment well    Behavior During Therapy [facility] for tasks assessed/performed             Past Medical History:  Diagnosis Date   Chronic back pain    Headache    Hypothyroidism    Past Surgical History:  Procedure Laterality Date   GANGLION CYST EXCISION Left 03/03/2021   Procedure: EXCISION MASS DORSUM LEFT WRIST;  Surgeon: Betha Loa, MD;  Location: [facility];  Service: Orthopedics;  Laterality: Left;  30 MIN   Patient Active Problem List   Diagnosis Date Noted   Obesity (BMI 30-39.9) 05/24/2017   Pregnancy complicated by Suboxone maintenance, antepartum (HCC) 10/11/2015   Drug dependence affecting pregnancy 10/11/2015    REFERRING PROVIDER: Renae Gloss PA-C  REFERRING DIAG: Patellofemoral pain syndrome of left knee.  THERAPY DIAG:  Acute pain of left knee  Rationale for Evaluation and Treatment Rehabilitation  ONSET DATE: 3-4 months.  SUBJECTIVE:   SUBJECTIVE STATEMENT: The patient presents to the clinic today with c/o left knee pain over the last 3-4 months and then an exacerbation when she slipped at work several days ago.  Her pain is rated at a 8/10 and can increase with walking.  In fact, she states that recently a walk around her block can produce higher pain-levels.  Rest and soaking in a bathtub helps to decrease her pain.  Walking and prolonged standing increases her pain.    PERTINENT HISTORY: H/o LBP, H/o left plantar fasciitis (recent injection was helpful), hypothyroidism  PAIN:  Are you having pain? Yes: NPRS scale: 6/10 Pain location:  Left knee Pain description: Ache, throbbing.  PRECAUTIONS: Other: Pain-free left LE there ex.  WEIGHT BEARING RESTRICTIONS No  FALLS:  Has patient fallen in last 6 months? Yes. Number of falls 1.   OCCUPATION: Works at [employer].  PLOF: Independent  PATIENT GOALS Get out of pain, do more and walk for exercise.   OBJECTIVE:    PATIENT SURVEYS:  LEFS 25.   POSTURE:  Bilateral patellar tilting.  PALPATION: C/o pain around periphery of left patella.  LOWER EXTREMITY ROM:  Normal active left knee range of motion.  LOWER EXTREMITY MMT:  Normal left LE strength.  LOWER EXTREMITY SPECIAL TESTS:  Normal left knee stability testing.  No significant pain reproduction with Clarke's test and patellofemoral compression test.  Crepitus noted with active left knee flexion and extension.  Decreased patellar mobility laterally.    GAIT: WNL.    ASSESSMENT:  CLINICAL IMPRESSION: The patient presents to OPPT with c/o left knee pain over the last 3-4 months months that was exacerbated after slipping at work a few days ago.  She has pain around the periphery of her left kneecap.  Her patella is remarkable for a lateral tilt and decreased mobility laterally.  She has normal range of motion and strength.  Her left knee demonstrated good stability.  She has crepitus with active left knee flexion and extension.  Her left knee pain has prohibited her from walking long  distances and standing for prolonged periods of time.  Patient will benefit from skilled physical therapy intervention to address pain and deficits.   OBJECTIVE IMPAIRMENTS decreased activity tolerance and pain.   ACTIVITY LIMITATIONS bending, standing, and locomotion level  PARTICIPATION LIMITATIONS: occupation and yard work  [facility] POTENTIAL: Excellent  CLINICAL DECISION MAKING: Stable/uncomplicated  EVALUATION COMPLEXITY: Low   GOALS:  LONG TERM GOALS: Target date: 10/15/2021   Independent with a  HEP. Baseline: No knowledge of appropriate there ex. Goal status: INITIAL  2.  Stand 30 minutes+ with left knee pain not > 2-3/10. Baseline: Prolonged standing can produce high pain-levels. Goal status: INITIAL  3.  Walk 1/2 miles with left knee pain not > 2-3/10. Baseline: Patient states that walking around her block produces high left knee pain-levels. Goal status: INITIAL     PLAN: PT FREQUENCY: 2x/week  PT DURATION: other: 5 weeks.  PLANNED INTERVENTIONS: Therapeutic exercises, Therapeutic activity, Patient/Family education, Self Care, Joint mobilization, and Manual therapy  PLAN FOR NEXT SESSION: Patellar mobs, pain-free there ex (O and CKC).   Zayd Bonet, [place], PT 09/10/2021, 1:34 PM

## 2021-10-01 ENCOUNTER — Ambulatory Visit: Payer: Medicaid Other | Attending: Orthopedic Surgery

## 2021-10-01 DIAGNOSIS — M25562 Pain in left knee: Secondary | ICD-10-CM | POA: Diagnosis present

## 2021-10-01 DIAGNOSIS — M545 Low back pain, unspecified: Secondary | ICD-10-CM | POA: Diagnosis present

## 2021-10-01 DIAGNOSIS — G8929 Other chronic pain: Secondary | ICD-10-CM | POA: Insufficient documentation

## 2021-10-01 NOTE — Therapy (Signed)
OUTPATIENT PHYSICAL THERAPY LOWER EXTREMITY EVALUATION   Patient Name: Amy Noble MRN: [id number] DOB:03/27/1985, 36 y.o., female Today's Date: 10/01/2021   PT End of Session - 10/01/21 1307     Visit Number 2    Number of Visits 10    Date for PT Re-Evaluation 10/15/21    PT Start Time 1300    PT Stop Time 1359    PT Time Calculation (min) 59 min    Activity Tolerance Patient tolerated treatment well    Behavior During Therapy [facility] for tasks assessed/performed             Past Medical History:  Diagnosis Date   Chronic back pain    Headache    Hypothyroidism    Past Surgical History:  Procedure Laterality Date   GANGLION CYST EXCISION Left 03/03/2021   Procedure: EXCISION MASS DORSUM LEFT WRIST;  Surgeon: Betha Loa, MD;  Location: [facility];  Service: Orthopedics;  Laterality: Left;  30 MIN   Patient Active Problem List   Diagnosis Date Noted   Obesity (BMI 30-39.9) 05/24/2017   Pregnancy complicated by Suboxone maintenance, antepartum (HCC) 10/11/2015   Drug dependence affecting pregnancy 10/11/2015    REFERRING PROVIDER: Renae Gloss PA-C  REFERRING DIAG: Patellofemoral pain syndrome of left knee.  THERAPY DIAG:  Acute pain of left knee  Rationale for Evaluation and Treatment Rehabilitation  ONSET DATE: 3-4 months.  SUBJECTIVE:   SUBJECTIVE STATEMENT: Pt reports increased back pain today. Pt reports 4/10 left knee pain and 6/10 low back pain.    PERTINENT HISTORY: H/o LBP, H/o left plantar fasciitis (recent injection was helpful), hypothyroidism  PAIN:  Are you having pain? Yes: NPRS scale: 6/10 Pain location: Left knee Pain description: Ache, throbbing.  PRECAUTIONS: Other: Pain-free left LE there ex.  PATIENT GOALS Get out of pain, do more and walk for exercise.   OBJECTIVE:  PATIENT SURVEYS:  LEFS 25.   POSTURE:  Bilateral patellar tilting.  PALPATION: C/o pain around periphery of left patella.  LOWER EXTREMITY  SPECIAL TESTS:  Normal left knee stability testing.  No significant pain reproduction with Clarke's test and patellofemoral compression test.  Crepitus noted with active left knee flexion and extension.  Decreased patellar mobility laterally.  TODAY'S TREATMENT                                     EXERCISE LOG  Exercise Repetitions and Resistance Comments  Nustep  Lvl 3 x 20 mins   LAQ 2# 20 reps, LLE   Seated Marching 2# 20 reps, LLE   Hip Abduction Red tband x 2 mins   Ball Squeeze 2 mins    Blank cell = exercise not performed today   Manual Therapy Joint Mobilizations: left knee, patellar mobs to left knee Taping: left knee, kinesiotape for patellar tracking      HOME EXERCISE PROGRAM:  [email or web address]  Access Code: [email or web address]   ASSESSMENT:  CLINICAL IMPRESSION: Pt arrives for today's treatment session reporting 4/10 left knee pain and 6/10 low back pain.  Pt reports that she is more bothered by her low back at this time.  Pt instructed in seated LLE exercises to increase knee strength and function.  Patellar mobs performed in all directions to left patella.  Kinesiotape applied to left knee for patellar tracking with appropriate education.  Pt given printed HEP of all exercises performed today.  Pt reported  3/10 left knee pain upon completion of today's treatment session.   OBJECTIVE IMPAIRMENTS decreased activity tolerance and pain.   ACTIVITY LIMITATIONS bending, standing, and locomotion level  PARTICIPATION LIMITATIONS: occupation and yard work  [facility] POTENTIAL: Excellent  CLINICAL DECISION MAKING: Stable/uncomplicated  EVALUATION COMPLEXITY: Low   GOALS:  LONG TERM GOALS: Target date: 11/05/2021   Independent with a HEP. Baseline: No knowledge of appropriate there ex. Goal status: INITIAL  2.  Stand 30 minutes+ with left knee pain not > 2-3/10. Baseline: Prolonged standing can produce high pain-levels. Goal status: INITIAL  3.  Walk 1/2 miles  with left knee pain not > 2-3/10. Baseline: Patient states that walking around her block produces high left knee pain-levels. Goal status: INITIAL     PLAN: PT FREQUENCY: 2x/week  PT DURATION: other: 5 weeks.  PLANNED INTERVENTIONS: Therapeutic exercises, Therapeutic activity, Patient/Family education, Self Care, Joint mobilization, and Manual therapy  PLAN FOR NEXT SESSION: Patellar mobs, pain-free there ex (O and CKC).   Newman Pies, PTA 10/01/2021, 2:05 PM

## 2021-10-06 ENCOUNTER — Ambulatory Visit: Payer: Medicaid Other

## 2021-10-06 DIAGNOSIS — G8929 Other chronic pain: Secondary | ICD-10-CM

## 2021-10-06 DIAGNOSIS — M25562 Pain in left knee: Secondary | ICD-10-CM

## 2021-10-06 NOTE — Therapy (Signed)
OUTPATIENT PHYSICAL THERAPY LOWER EXTREMITY EVALUATION   Patient Name: Amy Noble MRN: [id number] DOB:[birth year], 36 y.o., female Today's Date: 10/06/2021   PT End of Session - 10/06/21 1302     Visit Number 3    Number of Visits 10    Date for PT Re-Evaluation 10/15/21    PT Start Time 1300    PT Stop Time 1345    PT Time Calculation (min) 45 min    Activity Tolerance Patient tolerated treatment well    Behavior During Therapy [facility] for tasks assessed/performed             Past Medical History:  Diagnosis Date   Chronic back pain    Headache    Hypothyroidism    Past Surgical History:  Procedure Laterality Date   GANGLION CYST EXCISION Left 03/03/2021   Procedure: EXCISION MASS DORSUM LEFT WRIST;  Surgeon: Betha Loa, MD;  Location: [facility];  Service: Orthopedics;  Laterality: Left;  30 MIN   Patient Active Problem List   Diagnosis Date Noted   Obesity (BMI 30-39.9) 05/24/2017   Pregnancy complicated by Suboxone maintenance, antepartum (HCC) 10/11/2015   Drug dependence affecting pregnancy 10/11/2015    REFERRING PROVIDER: Renae Gloss PA-C  REFERRING DIAG: Patellofemoral pain syndrome of left knee.  THERAPY DIAG:  Acute pain of left knee  Chronic right-sided low back pain without sciatica  Rationale for Evaluation and Treatment Rehabilitation  ONSET DATE: 3-4 months.  SUBJECTIVE:   SUBJECTIVE STATEMENT: Pt reports feeling good today and denying pain.  Pt reports that the K-tape came off in the pool yesterday.  She could not tell a real difference in her pain level after working a shift with K-tape donned.  PERTINENT HISTORY: H/o LBP, H/o left plantar fasciitis (recent injection was helpful), hypothyroidism  PAIN:  Are you having pain? No  PRECAUTIONS: Other: Pain-free left LE there ex.  PATIENT GOALS Get out of pain, do more and walk for exercise.   OBJECTIVE:  PATIENT SURVEYS:  LEFS 25.   POSTURE:  Bilateral patellar  tilting.  PALPATION: C/o pain around periphery of left patella.  LOWER EXTREMITY SPECIAL TESTS:  Normal left knee stability testing.  No significant pain reproduction with Clarke's test and patellofemoral compression test.  Crepitus noted with active left knee flexion and extension.  Decreased patellar mobility laterally.  TODAY'S TREATMENT                                     EXERCISE LOG  Exercise Repetitions and Resistance Comments  Nustep  Lvl 4 x 20 mins   LAQ 2# 20 reps, BLE   Seated Marching 2# 20 reps, BLE   Hip Abduction Red tband x 2 mins   Ball Squeeze 2 mins   Ham Curls Red tband x 20 reps each    Blank cell = exercise not performed today   Manual Therapy Soft Tissue Mobilization: left knee, STW/M to medial and lateral left knee region Joint Mobilizations: left knee, patellar mobs to left knee in all directions      HOME EXERCISE PROGRAM:  [email or web address]  Access Code: [email or web address]   ASSESSMENT:  CLINICAL IMPRESSION: Pt arrives for today's treatment session denying any pain.  Pt reports that she could not tell any difference in her pain after work with the [employer].  Pt tolerated introduced to seated ham curls without issue or complaint.  STW/M performed  to left lateral and medial knee as well as patellar mobs.  Pt denied any pain at completion of today's treatment session.   OBJECTIVE IMPAIRMENTS decreased activity tolerance and pain.   ACTIVITY LIMITATIONS bending, standing, and locomotion level  PARTICIPATION LIMITATIONS: occupation and yard work  [facility] POTENTIAL: Excellent  CLINICAL DECISION MAKING: Stable/uncomplicated  EVALUATION COMPLEXITY: Low   GOALS:  LONG TERM GOALS: Target date: 11/10/2021   Independent with a HEP. Baseline: No knowledge of appropriate there ex. Goal status: INITIAL  2.  Stand 30 minutes+ with left knee pain not > 2-3/10. Baseline: Prolonged standing can produce high pain-levels. Goal status:  INITIAL  3.  Walk 1/2 miles with left knee pain not > 2-3/10. Baseline: Patient states that walking around her block produces high left knee pain-levels. Goal status: INITIAL     PLAN: PT FREQUENCY: 2x/week  PT DURATION: other: 5 weeks.  PLANNED INTERVENTIONS: Therapeutic exercises, Therapeutic activity, Patient/Family education, Self Care, Joint mobilization, and Manual therapy  PLAN FOR NEXT SESSION: Patellar mobs, pain-free there ex (O and CKC).   Newman Pies, PTA 10/06/2021, 2:09 PM

## 2021-10-09 ENCOUNTER — Ambulatory Visit: Payer: Medicaid Other

## 2021-10-09 DIAGNOSIS — M25562 Pain in left knee: Secondary | ICD-10-CM | POA: Diagnosis not present

## 2021-10-09 NOTE — Therapy (Signed)
OUTPATIENT PHYSICAL THERAPY LOWER EXTREMITY EVALUATION   Patient Name: Amy Noble MRN: [id number] DOB:1985-08-28, 36 y.o., female Today's Date: 10/09/2021   PT End of Session - 10/09/21 1307     Visit Number 4    Number of Visits 10    Date for PT Re-Evaluation 10/15/21    PT Start Time 1300    PT Stop Time 1402    PT Time Calculation (min) 62 min    Activity Tolerance Patient tolerated treatment well    Behavior During Therapy WFL for tasks assessed/performed             Past Medical History:  Diagnosis Date   Chronic back pain    Headache    Hypothyroidism    Past Surgical History:  Procedure Laterality Date   GANGLION CYST EXCISION Left 03/03/2021   Procedure: EXCISION MASS DORSUM LEFT WRIST;  Surgeon: Betha Loa, MD;  Location: [facility];  Service: Orthopedics;  Laterality: Left;  30 MIN   Patient Active Problem List   Diagnosis Date Noted   Obesity (BMI 30-39.9) 05/24/2017   Pregnancy complicated by Suboxone maintenance, antepartum (HCC) 10/11/2015   Drug dependence affecting pregnancy 10/11/2015    REFERRING PROVIDER: Renae Gloss PA-C  REFERRING DIAG: Patellofemoral pain syndrome of left knee.  THERAPY DIAG:  Acute pain of left knee  Rationale for Evaluation and Treatment Rehabilitation  ONSET DATE: 3-4 months.  SUBJECTIVE:   SUBJECTIVE STATEMENT: Pt reports that her back is hurting worse than her knee today.   PERTINENT HISTORY: H/o LBP, H/o left plantar fasciitis (recent injection was helpful), hypothyroidism  PAIN:  Are you having pain? Yes: NPRS scale: 3/10 Pain location: left knee; 6/10 low back  PRECAUTIONS: Other: Pain-free left LE there ex.  PATIENT GOALS Get out of pain, do more and walk for exercise.   OBJECTIVE:  PATIENT SURVEYS:  LEFS 25.   POSTURE:  Bilateral patellar tilting.  PALPATION: C/o pain around periphery of left patella.  LOWER EXTREMITY SPECIAL TESTS:  Normal left knee stability  testing.  No significant pain reproduction with Clarke's test and patellofemoral compression test.  Crepitus noted with active left knee flexion and extension.  Decreased patellar mobility laterally.  TODAY'S TREATMENT                                8/17     EXERCISE LOG  Exercise Repetitions and Resistance Comments  Nustep  Lvl 4 x 20 mins   Forward Step Ups 6 inch step; 20 reps BLE    Rockerboard 3 mins   LAQ Red t-band x 25 reps, BLE   Seated Marching Red t-band x 25 reps, BLE   Hip Abduction Red tband x 2 mins   Ball Squeeze 2 mins   Ham Curls Red tband x 20 reps each    Blank cell = exercise not performed today   Manual Therapy Soft Tissue Mobilization: left knee, STW/M to medial and lateral left knee region Joint Mobilizations: left knee, patellar mobs to left knee in all directions      HOME EXERCISE PROGRAM:  [email or web address]  Access Code: [email or web address]   ASSESSMENT:  CLINICAL IMPRESSION: Pt arrives for today's treatment session reporting 6/10 low back pain and 3/10 left knee pain.  Pt reports donning her patellar band for her last work shift and her pain was decreased at the end of the shift. Pt able to tolerate introduction to forward  step ups and standing rockerboard without issue.  Pt requiring min cues for sequencing with forward step ups.  Pt given printed HEP and red t-band to perform while at the beach.  STW/M performed to left knee with pt in supine with BLEs elevated.  Pt reported decreased pain at completion of today's treatment session.   OBJECTIVE IMPAIRMENTS decreased activity tolerance and pain.   ACTIVITY LIMITATIONS bending, standing, and locomotion level  PARTICIPATION LIMITATIONS: occupation and yard work  [facility] POTENTIAL: Excellent  CLINICAL DECISION MAKING: Stable/uncomplicated  EVALUATION COMPLEXITY: Low   GOALS:  LONG TERM GOALS: Target date: 11/13/2021   Independent with a HEP. Baseline: No knowledge of appropriate there  ex. Goal status: INITIAL  2.  Stand 30 minutes+ with left knee pain not > 2-3/10. Baseline: Prolonged standing can produce high pain-levels. Goal status: INITIAL  3.  Walk 1/2 miles with left knee pain not > 2-3/10. Baseline: Patient states that walking around her block produces high left knee pain-levels. Goal status: INITIAL     PLAN: PT FREQUENCY: 2x/week  PT DURATION: other: 5 weeks.  PLANNED INTERVENTIONS: Therapeutic exercises, Therapeutic activity, Patient/Family education, Self Care, Joint mobilization, and Manual therapy  PLAN FOR NEXT SESSION: Patellar mobs, pain-free there ex (O and CKC).   Newman Pies, PTA 10/09/2021, 2:08 PM

## 2021-10-30 ENCOUNTER — Ambulatory Visit: Payer: Medicaid Other | Attending: Orthopedic Surgery

## 2021-10-30 DIAGNOSIS — M25562 Pain in left knee: Secondary | ICD-10-CM | POA: Insufficient documentation

## 2021-10-30 DIAGNOSIS — M545 Low back pain, unspecified: Secondary | ICD-10-CM | POA: Insufficient documentation

## 2021-10-30 DIAGNOSIS — G8929 Other chronic pain: Secondary | ICD-10-CM | POA: Diagnosis present

## 2021-10-30 NOTE — Therapy (Signed)
OUTPATIENT PHYSICAL THERAPY LOWER EXTREMITY EVALUATION   Patient Name: Amy Noble MRN: [id number] DOB:[birth year], 36 y.o., female 82 Date: 10/30/2021   PT End of Session - 10/30/21 0906     Visit Number 5    Number of Visits 10    Date for PT Re-Evaluation 10/15/21    PT Start Time 0906    Activity Tolerance Patient tolerated treatment well    Behavior During Therapy [facility] for tasks assessed/performed             Past Medical History:  Diagnosis Date   Chronic back pain    Headache    Hypothyroidism    Past Surgical History:  Procedure Laterality Date   GANGLION CYST EXCISION Left 03/03/2021   Procedure: EXCISION MASS DORSUM LEFT WRIST;  Surgeon: Leanora Cover, MD;  Location: [facility];  Service: Orthopedics;  Laterality: Left;  30 MIN   Patient Active Problem List   Diagnosis Date Noted   Obesity (BMI 30-39.9) 05/24/2017   Pregnancy complicated by Suboxone maintenance, antepartum ([place]) 10/11/2015   Drug dependence affecting pregnancy 10/11/2015    REFERRING PROVIDER: Truman Hayward PA-C  REFERRING DIAG: Patellofemoral pain syndrome of left knee.  THERAPY DIAG:  Acute pain of left knee  Chronic right-sided low back pain without sciatica  Rationale for Evaluation and Treatment Rehabilitation  ONSET DATE: 3-4 months.  SUBJECTIVE:   SUBJECTIVE STATEMENT: Pt reports that her knee did well at the beach.  Pt reports 5/10 left knee pain.  PERTINENT HISTORY: H/o LBP, H/o left plantar fasciitis (recent injection was helpful), hypothyroidism  PAIN:  Are you having pain? Yes: NPRS scale: 5/10 Pain location: left knee; 6/10 low back  PRECAUTIONS: Other: Pain-free left LE there ex.  PATIENT GOALS Get out of pain, do more and walk for exercise.   OBJECTIVE:  PATIENT SURVEYS:  LEFS 25.   POSTURE:  Bilateral patellar tilting.  PALPATION: C/o pain around periphery of left patella.  LOWER EXTREMITY SPECIAL TESTS:  Normal left knee  stability testing.  No significant pain reproduction with Clarke's test and patellofemoral compression test.  Crepitus noted with active left knee flexion and extension.  Decreased patellar mobility laterally.  TODAY'S TREATMENT                              9/5    EXERCISE LOG  Exercise Repetitions and Resistance Comments  Nustep  Lvl 4 x 20 mins   Forward Step Ups 6 inch step; 25 reps BLE    Rockerboard 3 mins   LAQ 4# x 25 reps, BLE   Seated Marching 4# x 25 reps, BLE   Hip Abduction Green tband x 2 mins   Ball Squeeze 2 mins   Ham Curls Green tband x 25  reps each    Blank cell = exercise not performed today   Manual Therapy Soft Tissue Mobilization: left knee, STW/M to medial and lateral left knee region Joint Mobilizations: left knee, patellar mobs to left knee in all directions      HOME EXERCISE PROGRAM:  [email or web address]  Access Code: [email or web address]   ASSESSMENT:  CLINICAL IMPRESSION: Pt arrives for today's treatment reporting 5/10 left knee pain.  Pt reports that her knee did well at the beach last week.  Pt able to tolerate increased weight with all resisted exercises today without issue.  STW/M performed to medial and lateral left knee to decrease pain.  Pt reported 3/10 left  knee pain at completion of today's treatment session.    OBJECTIVE IMPAIRMENTS decreased activity tolerance and pain.   ACTIVITY LIMITATIONS bending, standing, and locomotion level  PARTICIPATION LIMITATIONS: occupation and yard work  [employer] POTENTIAL: Excellent  CLINICAL DECISION MAKING: Stable/uncomplicated  EVALUATION COMPLEXITY: Low   GOALS:  LONG TERM GOALS: Target date: 12/04/2021   Independent with a HEP. Baseline: No knowledge of appropriate there ex. Goal status: IN PROGRESS  2.  Stand 30 minutes+ with left knee pain not > 2-3/10. Baseline: Prolonged standing can produce high pain-levels. 10/30/21: Depends on the day and surface Goal status: PARTIALLY MET  3.  Walk  1/2 miles with left knee pain not > 2-3/10. Baseline: Patient states that walking around her block produces high left knee pain-levels. Goal status: MET     PLAN: PT FREQUENCY: 2x/week  PT DURATION: other: 5 weeks.  PLANNED INTERVENTIONS: Therapeutic exercises, Therapeutic activity, Patient/Family education, Self Care, Joint mobilization, and Manual therapy  PLAN FOR NEXT SESSION: Patellar mobs, pain-free there ex (O and CKC).   Kathrynn Ducking, PTA 10/30/2021, 9:07 AM

## 2021-11-04 ENCOUNTER — Encounter: Payer: Medicaid Other | Admitting: Physical Therapy

## 2021-11-12 ENCOUNTER — Ambulatory Visit: Payer: Medicaid Other

## 2021-11-12 DIAGNOSIS — M25562 Pain in left knee: Secondary | ICD-10-CM | POA: Diagnosis not present

## 2021-11-12 NOTE — Therapy (Signed)
OUTPATIENT PHYSICAL THERAPY LOWER EXTREMITY EVALUATION   Patient Name: Amy Noble MRN: [id number] DOB:[birth year], 36 y.o., female Today's Date: 11/12/2021   PT End of Session - 11/12/21 1038     Visit Number 6    Number of Visits 10    Date for PT Re-Evaluation 10/15/21    PT Start Time 1036   Patient arrived late to her appointment   PT Stop Time 1115    PT Time Calculation (min) 39 min    Activity Tolerance Patient tolerated treatment well    Behavior During Therapy [facility] for tasks assessed/performed              Past Medical History:  Diagnosis Date   Chronic back pain    Headache    Hypothyroidism    Past Surgical History:  Procedure Laterality Date   GANGLION CYST EXCISION Left 03/03/2021   Procedure: EXCISION MASS DORSUM LEFT WRIST;  Surgeon: Leanora Cover, MD;  Location: [facility];  Service: Orthopedics;  Laterality: Left;  30 MIN   Patient Active Problem List   Diagnosis Date Noted   Obesity (BMI 30-39.9) 05/24/2017   Pregnancy complicated by Suboxone maintenance, antepartum ([place]) 10/11/2015   Drug dependence affecting pregnancy 10/11/2015    REFERRING PROVIDER: Truman Hayward PA-C  REFERRING DIAG: Patellofemoral pain syndrome of left knee.  THERAPY DIAG:  Acute pain of left knee  Rationale for Evaluation and Treatment Rehabilitation  ONSET DATE: 3-4 months.  SUBJECTIVE:   SUBJECTIVE STATEMENT: Patient reports that her knee feels alright today as it is not hurting as bad. She notes that her knee was bothering her more last night.   PERTINENT HISTORY: H/o LBP, H/o left plantar fasciitis (recent injection was helpful), hypothyroidism  PAIN:  Are you having pain? Yes: NPRS scale: 3/10 Pain location: left knee; 6/10 low back  PRECAUTIONS: Other: Pain-free left LE there ex.  PATIENT GOALS Get out of pain, do more and walk for exercise.   OBJECTIVE:  PATIENT SURVEYS:  LEFS 25.   POSTURE:  Bilateral patellar  tilting.  PALPATION: C/o pain around periphery of left patella.  LOWER EXTREMITY SPECIAL TESTS:  Normal left knee stability testing.  No significant pain reproduction with Clarke's test and patellofemoral compression test.  Crepitus noted with active left knee flexion and extension.  Decreased patellar mobility laterally.  TODAY'S TREATMENT                                   9/20 EXERCISE LOG  Exercise Repetitions and Resistance Comments  Nustep L4 x 15 minutes   Rocker board  2 minutes   Step up 6" step; 25 reps each   Lunges onto BOSU 25 reps each        Blank cell = exercise not performed today  Manual Therapy Soft Tissue Mobilization: left quadriceps, for reduced pain Joint Mobilizations: patellar, grade I-IV for reduced pain and improved mobility                                 9/5    EXERCISE LOG  Exercise Repetitions and Resistance Comments  Nustep  Lvl 4 x 20 mins   Forward Step Ups 6 inch step; 25 reps BLE    Rockerboard 3 mins   LAQ 4# x 25 reps, BLE   Seated Marching 4# x 25 reps, BLE  Hip Abduction Green tband x 2 mins   Ball Squeeze 2 mins   Ham Curls Green tband x 25  reps each    Blank cell = exercise not performed today   Manual Therapy Soft Tissue Mobilization: left knee, STW/M to medial and lateral left knee region Joint Mobilizations: left knee, patellar mobs to left knee in all directions      HOME EXERCISE PROGRAM:  [email or web address]  Access Code: [email or web address]   ASSESSMENT:  CLINICAL IMPRESSION: Patient was introduced to lunges onto a BOSU for improved knee stability needed for functional activities. She required minimal cueing with lunges for proper exercise performance. Manual therapy focused on reduced knee pain with patellar joint mobilizations and soft tissue mobilization to the quadriceps being the most effective. She reported feeling good upon the conclusion of treatment. She continues to require skilled physical therapy to address  her remaining impairments to return to her prior level of function.    OBJECTIVE IMPAIRMENTS decreased activity tolerance and pain.   ACTIVITY LIMITATIONS bending, standing, and locomotion level  PARTICIPATION LIMITATIONS: occupation and yard work  [employer] POTENTIAL: Excellent  CLINICAL DECISION MAKING: Stable/uncomplicated  EVALUATION COMPLEXITY: Low   GOALS:  LONG TERM GOALS: Target date: 12/17/2021   Independent with a HEP. Baseline:  Goal status: MET  2.  Stand 30 minutes+ with left knee pain not > 2-3/10. Baseline: Prolonged standing can produce high pain-levels. 10/30/21: Depends on the day and surface Goal status: PARTIALLY MET  3.  Walk 1/2 miles with left knee pain not > 2-3/10. Baseline: Patient states that walking around her block produces high left knee pain-levels. Goal status: MET     PLAN: PT FREQUENCY: 2x/week  PT DURATION: other: 5 weeks.  PLANNED INTERVENTIONS: Therapeutic exercises, Therapeutic activity, Patient/Family education, Self Care, Joint mobilization, and Manual therapy  PLAN FOR NEXT SESSION: Patellar mobs, pain-free there ex (O and CKC).   Darlin Coco, PT 11/12/2021, 12:41 PM

## 2021-11-12 NOTE — Addendum Note (Signed)
Addended by: Darlin Coco on: 11/12/2021 12:53 PM   Modules accepted: Orders

## 2021-11-17 ENCOUNTER — Ambulatory Visit: Payer: Medicaid Other

## 2021-11-17 DIAGNOSIS — M25562 Pain in left knee: Secondary | ICD-10-CM | POA: Diagnosis not present

## 2021-11-17 NOTE — Therapy (Signed)
OUTPATIENT PHYSICAL THERAPY LOWER EXTREMITY TREATMENT   Patient Name: Amy Noble MRN: [id number] DOB:[birth year], 36 y.o., female Today's Date: 11/17/2021   PT End of Session - 11/17/21 1035     Visit Number 7    Number of Visits 10    Date for PT Re-Evaluation 11/17/21    PT Start Time 2774    PT Stop Time 1115    PT Time Calculation (min) 43 min    Activity Tolerance Patient tolerated treatment well    Behavior During Therapy WFL for tasks assessed/performed              Past Medical History:  Diagnosis Date   Chronic back pain    Headache    Hypothyroidism    Past Surgical History:  Procedure Laterality Date   GANGLION CYST EXCISION Left 03/03/2021   Procedure: EXCISION MASS DORSUM LEFT WRIST;  Surgeon: Leanora Cover, MD;  Location: [facility];  Service: Orthopedics;  Laterality: Left;  30 MIN   Patient Active Problem List   Diagnosis Date Noted   Obesity (BMI 30-39.9) 05/24/2017   Pregnancy complicated by Suboxone maintenance, antepartum ([place]) 10/11/2015   Drug dependence affecting pregnancy 10/11/2015    REFERRING PROVIDER: Truman Hayward PA-C  REFERRING DIAG: Patellofemoral pain syndrome of left knee.  THERAPY DIAG:  Acute pain of left knee  Rationale for Evaluation and Treatment Rehabilitation  ONSET DATE: 3-4 months.  SUBJECTIVE:   SUBJECTIVE STATEMENT: Patient reports that her pain increased to a 7/10 last night after working, but is feeling much better today.   PERTINENT HISTORY: H/o LBP, H/o left plantar fasciitis (recent injection was helpful), hypothyroidism  PAIN:  Are you having pain? Yes: NPRS scale: 2/10 Pain location: left knee; 6/10 low back  PRECAUTIONS: Other: Pain-free left LE there ex.  PATIENT GOALS Get out of pain, do more and walk for exercise.   OBJECTIVE:  PATIENT SURVEYS:  LEFS 25.   POSTURE:  Bilateral patellar tilting.  PALPATION: C/o pain around periphery of left patella.  LOWER EXTREMITY  SPECIAL TESTS:  Normal left knee stability testing.  No significant pain reproduction with Clarke's test and patellofemoral compression test.  Crepitus noted with active left knee flexion and extension.  Decreased patellar mobility laterally.  TODAY'S TREATMENT                                   9/25 EXERCISE LOG  Exercise Repetitions and Resistance Comments  Nustep L4 x 20 minutes   Rocker board  3 minutes   Step up 8" step; 25 reps each   Lunges onto BOSU 25 reps each        Blank cell = exercise not performed today  Manual Therapy Soft Tissue Mobilization: left quadriceps, for reduced pain Joint Mobilizations: patellar, grade I-IV for reduced pain and improved mobility   HOME EXERCISE PROGRAM:  [email or web address]  Access Code: [email or web address]   ASSESSMENT:  CLINICAL IMPRESSION: Pt arrives for today's treatment session reporting 2/10 left knee pain.  Pt reports that pain increased to 7/10 last night after her shift.  Pt has met all of her goals at this time.  Pt instructed to continue performing HEP as previously instructed.  Pt encouraged to call the facility with any questions or concerns.  Pt ready for discharge at this time.    OBJECTIVE IMPAIRMENTS decreased activity tolerance and pain.   ACTIVITY LIMITATIONS bending, standing, and locomotion level  PARTICIPATION LIMITATIONS: occupation and yard work  [employer] POTENTIAL: Excellent  CLINICAL DECISION MAKING: Stable/uncomplicated  EVALUATION COMPLEXITY: Low   GOALS:  LONG TERM GOALS: Target date: 12/17/2021   Independent with a HEP. Baseline:  Goal status: MET  2.  Stand 30 minutes+ with left knee pain not > 2-3/10. Baseline: Prolonged standing can produce high pain-levels. 10/30/21: Depends on the day and surface Goal status: PARTIALLY MET  3.  Walk 1/2 miles with left knee pain not > 2-3/10. Baseline: Patient states that walking around her block produces high left knee pain-levels. Goal status:  MET     PLAN: PT FREQUENCY: 2x/week  PT DURATION: other: 5 weeks.  PLANNED INTERVENTIONS: Therapeutic exercises, Therapeutic activity, Patient/Family education, Self Care, Joint mobilization, and Manual therapy  PLAN FOR NEXT SESSION: Patellar mobs, pain-free there ex (O and CKC).   Kathrynn Ducking, PTA 11/17/2021, 11:34 AM   PHYSICAL THERAPY DISCHARGE SUMMARY  Visits from Start of Care: 7.  Current functional level related to goals / functional outcomes: See above.   Remaining deficits: Good progress.  LTG partially met (see baove).   Education / Equipment: HEP.   Patient agrees to discharge. Patient goals were partially met. Patient is being discharged due to being pleased with the current functional level.    Mali Applegate MPT

## 2021-12-24 ENCOUNTER — Other Ambulatory Visit: Payer: Self-pay | Admitting: Nurse Practitioner

## 2021-12-24 DIAGNOSIS — E039 Hypothyroidism, unspecified: Secondary | ICD-10-CM

## 2021-12-29 ENCOUNTER — Ambulatory Visit
Admission: RE | Admit: 2021-12-29 | Discharge: 2021-12-29 | Disposition: A | Discharge status: Home / Self Care | Payer: Medicaid Other | Source: Ambulatory Visit | Attending: Nurse Practitioner | Admitting: Nurse Practitioner

## 2021-12-29 DIAGNOSIS — E039 Hypothyroidism, unspecified: Secondary | ICD-10-CM

## 2021-12-30 ENCOUNTER — Ambulatory Visit (INDEPENDENT_AMBULATORY_CARE_PROVIDER_SITE_OTHER): Payer: Medicaid Other

## 2021-12-30 ENCOUNTER — Ambulatory Visit (INDEPENDENT_AMBULATORY_CARE_PROVIDER_SITE_OTHER): Payer: Medicaid Other | Admitting: Orthopedic Surgery

## 2021-12-30 VITALS — Ht 63.0 in | Wt 215.0 lb

## 2021-12-30 DIAGNOSIS — G8929 Other chronic pain: Secondary | ICD-10-CM | POA: Diagnosis not present

## 2021-12-30 DIAGNOSIS — M545 Low back pain, unspecified: Secondary | ICD-10-CM
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
## 2022-01-05 ENCOUNTER — Encounter (INDEPENDENT_AMBULATORY_CARE_PROVIDER_SITE_OTHER): Payer: Self-pay

## 2022-01-05 NOTE — Telephone Encounter (Signed)
Please call pt

## 2022-01-28 ENCOUNTER — Encounter (INDEPENDENT_AMBULATORY_CARE_PROVIDER_SITE_OTHER): Payer: Medicaid Other | Admitting: Family Medicine

## 2022-02-02 ENCOUNTER — Ambulatory Visit (INDEPENDENT_AMBULATORY_CARE_PROVIDER_SITE_OTHER): Payer: Medicaid Other | Admitting: Internal Medicine

## 2022-02-02 ENCOUNTER — Encounter (INDEPENDENT_AMBULATORY_CARE_PROVIDER_SITE_OTHER): Payer: Self-pay | Admitting: Internal Medicine

## 2022-02-02 VITALS — BP 124/84 | HR 97 | Temp 97.8°F | Ht 64.0 in | Wt 225.0 lb

## 2022-02-02 DIAGNOSIS — E669 Obesity, unspecified: Secondary | ICD-10-CM

## 2022-02-02 DIAGNOSIS — E559 Vitamin D deficiency, unspecified: Secondary | ICD-10-CM

## 2022-02-02 DIAGNOSIS — E66812 Obesity, class 2: Secondary | ICD-10-CM

## 2022-02-02 DIAGNOSIS — Z6838 Body mass index (BMI) 38.0-38.9, adult: Secondary | ICD-10-CM | POA: Diagnosis not present

## 2022-02-02 NOTE — Progress Notes (Signed)
Office: [phone]  /  Fax: [phone]   Initial Visit  Amy Noble was seen in clinic today to evaluate for obesity. She is interested in losing weight to improve overall health and reduce the risk of weight related complications. She presents today to review program treatment options, initial physical assessment, and evaluation.  She reports having problems with her weight since he was a she was young.  She has 3 children and has been trying to lose weight on her own.  She was referred by: PCP  When asked what else they would like to accomplish? She states: Adopt healthier eating patterns and Improve quality of life  When asked how has your weight affected you? She states: Contributed to orthopedic problems or mobility issues, Having fatigue, and Having poor endurance  Some associated conditions: Other: Vitamin D deficiency  Contributing factors: Family history, Stress, and Pregnancy  Weight promoting medications identified: None  Current nutrition plan: Low-carb, High-protein, and Journaling  Current level of physical activity: Walking and Strength training  Current or previous pharmacotherapy: None  Response to medication: Never tried medications   Past medical history includes:   Past Medical History:  Diagnosis Date   Chronic back pain    Headache    Hypothyroidism      Objective:   Pulse 97   Temp 97.8 F (36.6 C)   Ht 5\' 4"  (1.626 m)   Wt 225 lb (102.1 kg)   SpO2 98%   BMI 38.62 kg/m  She was weighed on the bioimpedance scale: Body mass index is 38.62 kg/m.  Peak Weight: 235,Visceral Fat Rating: 11, Body Fat%: 44.2, Weight trend over the last 12 months: Unchanged  General:  Alert, oriented and cooperative. Patient is in no acute distress.  Respiratory: Normal respiratory effort, no problems with respiration noted  Extremities: Normal range of motion.    Mental Status: Normal mood and affect. Normal behavior. Normal judgment and thought content.    Assessment and Plan:  1. 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 She would benefit from weight loss therapy via a modified calorie, low-carb, high-protein nutritional plan tailored to their REE (resting energy expenditure) which will be determined by indirect calorimetry.  We will also assess for cardiometabolic risk and nutritional derangements via fasting serologies at her next appointment.   I reviewed labs at [facility] and also in Care Everywhere I cannot find any recent metabolic serologies.  2. Vitamin D deficiency By history, currently not on supplementation.  This is associated with adiposity and may contribute to leptin resistance and weight gain.       Obesity Treatment / Action Plan:  Patient will work on garnering support from family and friends to begin weight loss journey. Will work on eliminating or reducing the presence of highly palatable, calorie dense foods in the home. Will complete provided nutritional and psychosocial assessment questionnaire before the next appointment. Will be scheduled for indirect calorimetry to determine resting energy expenditure in a fasting state.  This will allow [facility] to create a reduced calorie, high-protein meal plan to promote loss of fat mass while preserving muscle mass. Counseled on the health benefits of losing 5%-15% of total body weight. Was counseled on nutritional approaches to weight loss and benefits of complex carbs and high quality protein as part of nutritional weight management. Was counseled on pharmacotherapy and  role as an adjunct in weight management.   Obesity Education Performed Today:  She was weighed on the bioimpedance scale and results were discussed and documented in the synopsis.  We discussed obesity as a disease and the  importance of a more detailed evaluation of all the factors contributing to the disease.  We discussed the importance of long term lifestyle changes which include nutrition, exercise and behavioral modifications as well as the importance of customizing this to her specific health and social needs.  We discussed the benefits of reaching a healthier weight to alleviate the symptoms of existing conditions and reduce the risks of the biomechanical, metabolic and psychological effects of obesity.  Amy Noble appears to be in the action stage of change and states they are ready to start intensive lifestyle modifications and behavioral modifications.  She will be contacting her clinic to enroll in program possibly in January.  30 minutes was spent today on this visit including the above counseling, pre-visit chart review, and post-visit documentation.  Reviewed by clinician on day of visit: allergies, medications, problem list, medical history, surgical history, family history, social history, and previous encounter notes.    I have reviewed the above documentation for accuracy and completeness, and I agree with the above.  Thomes Dinning, MD

## 2023-04-19 IMAGING — MR MR LUMBAR SPINE W/O CM
5 series · 31 of 48 positions shown · non-contrast
Comparison: Radiograph 01/16/2019

CLINICAL DATA: Lower back pain

EXAM:
MRI LUMBAR SPINE WITHOUT CONTRAST
TECHNIQUE: Multiplanar, multisequence MR imaging of the lumbar spine was
performed. No intravenous contrast was administered.

[Series 5: T2 · sagittal · 4.0mm · 0.68mm/px · 6 of 15 slices shown (1 of 2)]
[im 1/15]
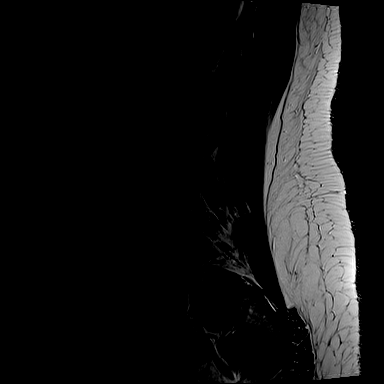
[im 3/15]
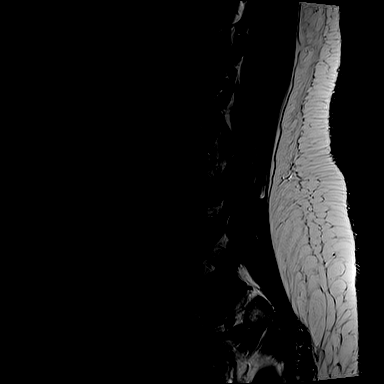
[im 6/15]
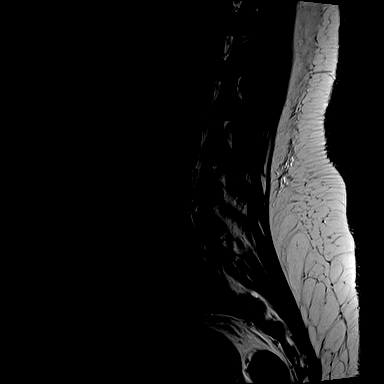
[im 9/15]
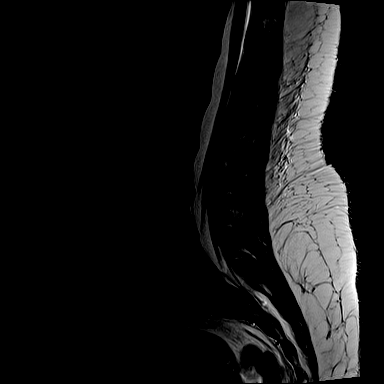
[im 12/15]
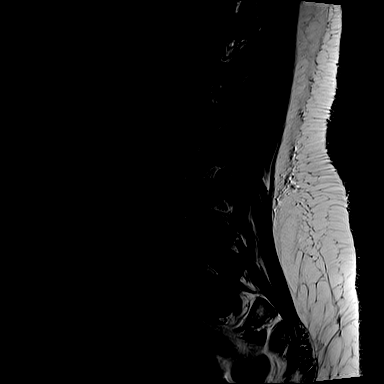
[im 15/15]
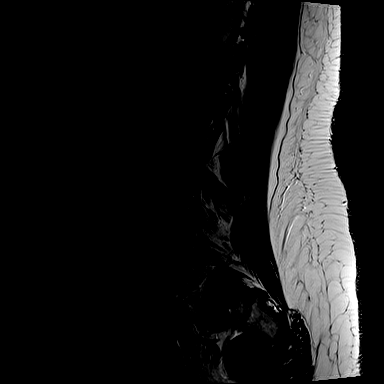

[Series 6: T1 · sagittal · 4.0mm · 0.81mm/px · 6 of 15 slices shown (1 of 2)]
[im 1/15]
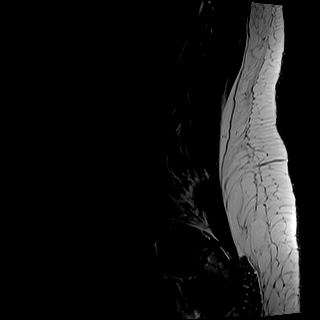
[im 3/15]
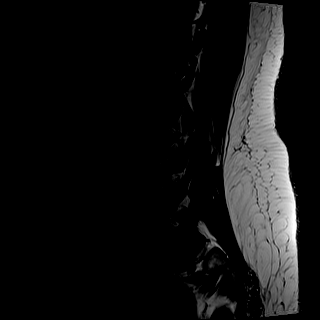
[im 6/15]
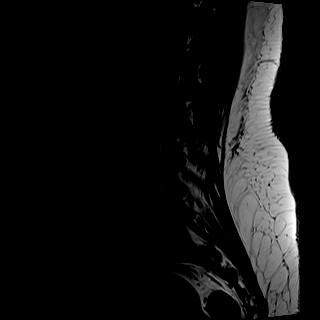
[im 9/15]
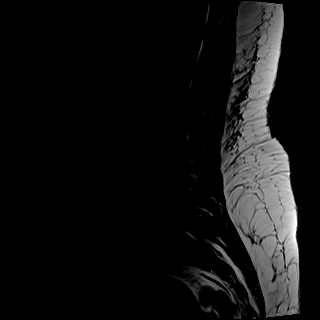
[im 12/15]
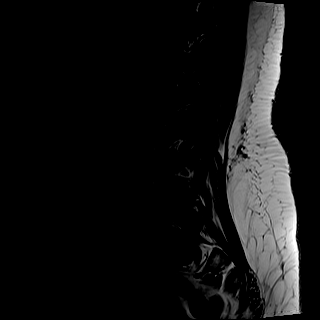
[im 15/15]
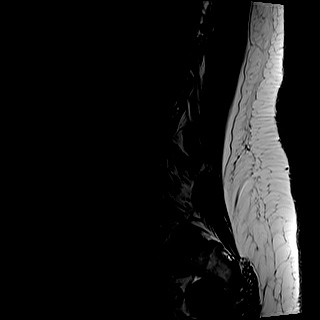

[Series 7: STIR · sagittal · 4.0mm · 0.51mm/px · 1 of 15 slices shown]
[im 1/15]
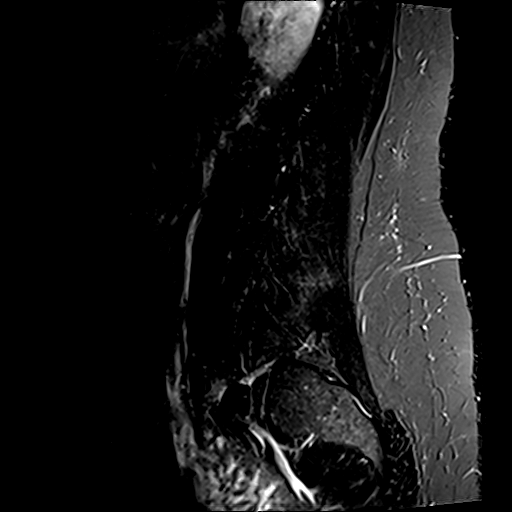

[Series 8: T2 · axial · 4.0mm · 0.70mm/px · z∈[-3,+238]mm · 9 of 36 slices shown (2 of 2)]
[im 1/36]
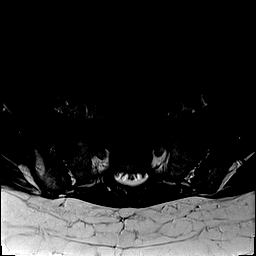
[im 6/36]
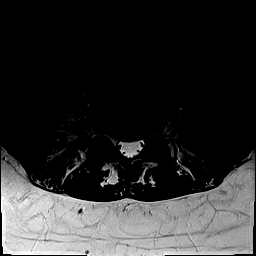
[im 11/36]
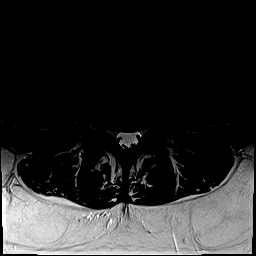
[im 16/36]
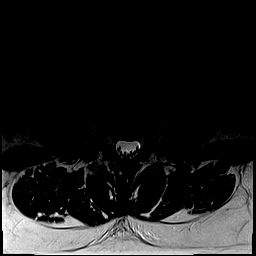
[im 18/36]
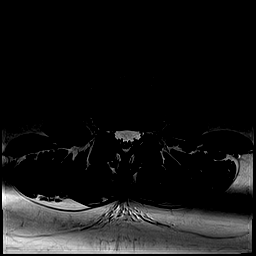
[im 21/36]
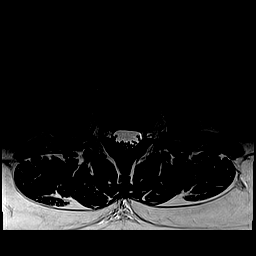
[im 26/36]
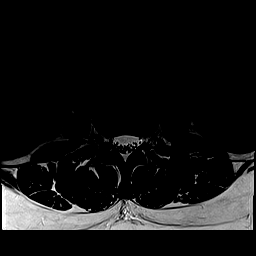
[im 31/36]
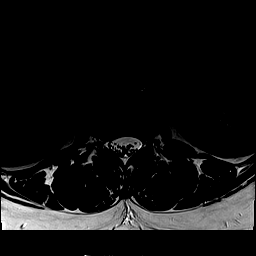
[im 36/36]
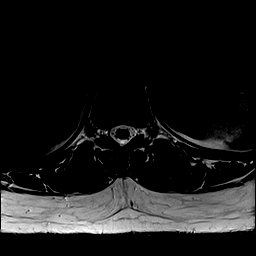

[Series 9: T1 · axial · 4.0mm · 0.35mm/px · z∈[-3,+238]mm · 9 of 36 slices shown (2 of 2)]
[im 1/36]
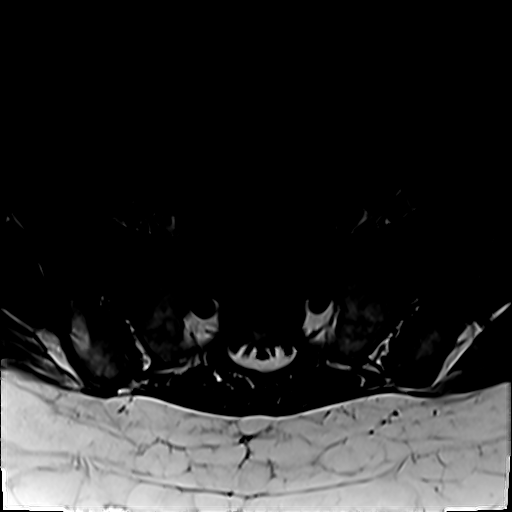
[im 6/36]
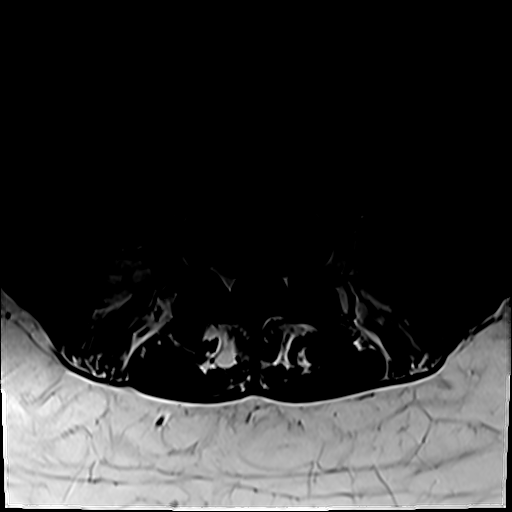
[im 11/36]
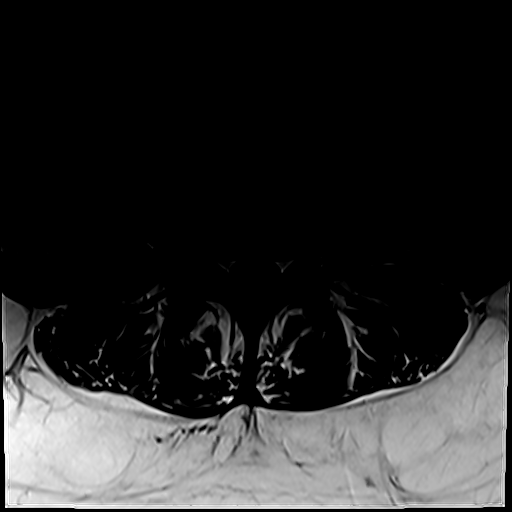
[im 16/36]
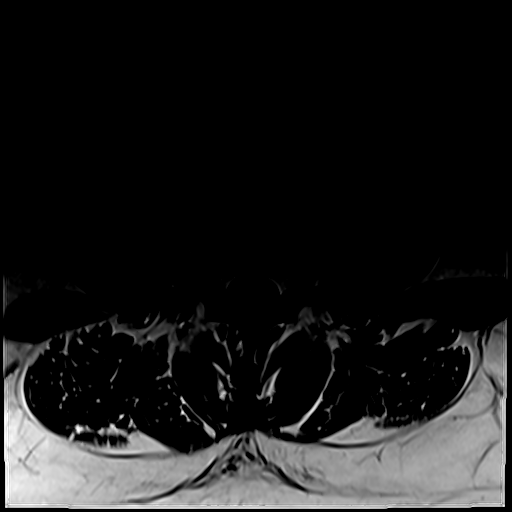
[im 18/36]
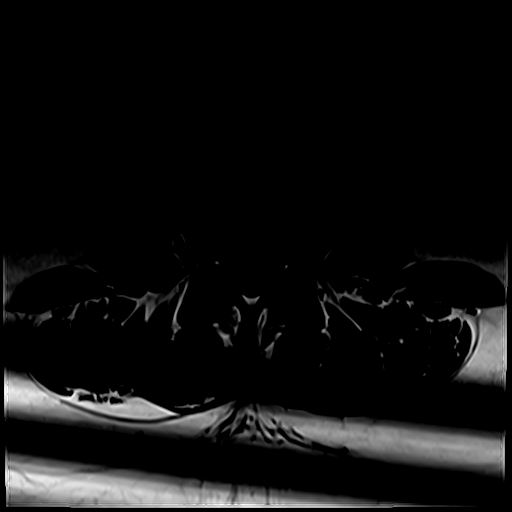
[im 21/36]
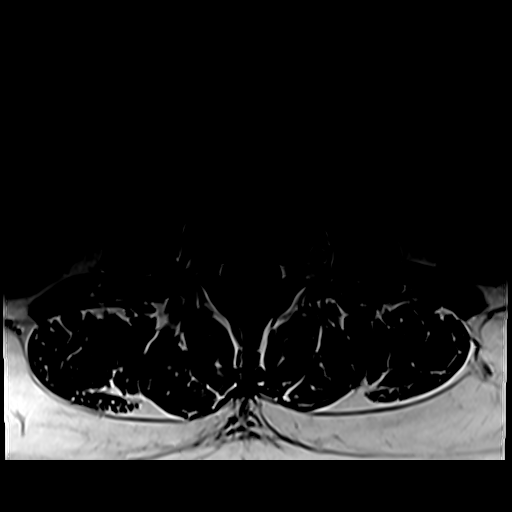
[im 26/36]
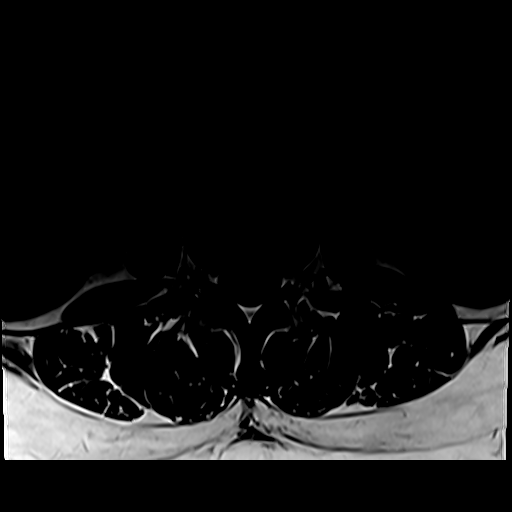
[im 31/36]
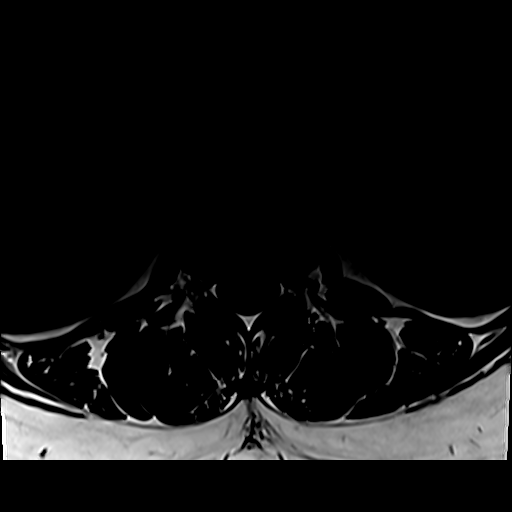
[im 36/36]
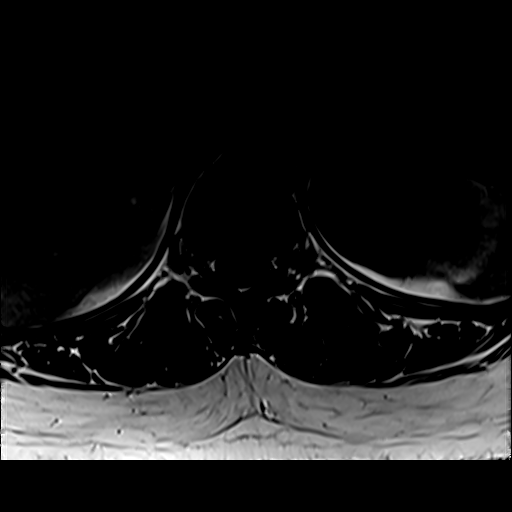

[31 of 48 positions shown; findings below may reference images not displayed]

FINDINGS: Segmentation: For the purposes of this dictation, the lowest
lumbar-type appearing vertebrae is labeled as L5. Independent
verification of levels should be performed prior to any planned
intervention.

Alignment:  Physiologic.

Vertebrae: No fracture, evidence of discitis, or aggressive bone
lesion. Likely tiny hemangioma in the L5 vertebral body.

Conus medullaris and cauda equina: Conus extends to the L1 level.
Conus and cauda equina appear normal.

Paraspinal and other soft tissues: Unremarkable.

Disc levels:

T12-L1: No significant spinal canal or neural foraminal narrowing.

L1-L2: No significant spinal canal or neural foraminal narrowing.

L2-L3: No significant spinal canal or neural foraminal narrowing.

L3-L4: Disc desiccation with height loss and Modic type 2
degenerative endplate changes. There is broad-based disc bulging
which results in minimal spinal canal narrowing and moderate right
and mild left neural foraminal narrowing. There is no significant
left neural foraminal narrowing.

L4-L5: No significant spinal canal or neural foraminal narrowing

L5-S1: No significant spinal canal or neural foraminal narrowing.
IMPRESSION: Disc height loss with broad-based disc bulging at L3-L4 results in
moderate right and mild left neural foraminal narrowing.
